# Patient Record
Sex: Female | Born: 2000 | Race: White | Hispanic: No | Marital: Single | Smoking: Never smoker
Health system: Southern US, Community
[De-identification: ages and names within clinical notes are randomized; demographics above are authoritative.]

## PROBLEM LIST (undated history)

## (undated) DIAGNOSIS — K219 Gastro-esophageal reflux disease without esophagitis: Secondary | ICD-10-CM

## (undated) DIAGNOSIS — E301 Precocious puberty: Secondary | ICD-10-CM

## (undated) DIAGNOSIS — IMO0001 Reserved for inherently not codable concepts without codable children: Secondary | ICD-10-CM

## (undated) HISTORY — PX: MRI: SHX5353

## (undated) HISTORY — PX: CLOSED REDUCTION FIBULA: SHX5411

---
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
## 2021-04-20 ENCOUNTER — Encounter (HOSPITAL_COMMUNITY): Payer: Self-pay

## 2021-04-20 ENCOUNTER — Other Ambulatory Visit: Payer: Self-pay

## 2021-04-20 ENCOUNTER — Emergency Department (HOSPITAL_COMMUNITY)
Admission: EM | Admit: 2021-04-20 | Discharge: 2021-04-20 | Disposition: A | Discharge status: Home / Self Care | Payer: Medicaid Other | Attending: Emergency Medicine | Admitting: Emergency Medicine

## 2021-04-20 DIAGNOSIS — Z7722 Contact with and (suspected) exposure to environmental tobacco smoke (acute) (chronic): Secondary | ICD-10-CM | POA: Insufficient documentation

## 2021-04-20 DIAGNOSIS — R309 Painful micturition, unspecified: Secondary | ICD-10-CM | POA: Diagnosis not present

## 2021-04-20 DIAGNOSIS — R300 Dysuria: Secondary | ICD-10-CM | POA: Insufficient documentation

## 2021-04-20 DIAGNOSIS — R102 Pelvic and perineal pain: Secondary | ICD-10-CM | POA: Diagnosis not present

## 2021-04-20 DIAGNOSIS — R21 Rash and other nonspecific skin eruption: Secondary | ICD-10-CM | POA: Diagnosis not present

## 2021-04-20 LAB — WET PREP, GENITAL
Sperm: NONE SEEN
Trich, Wet Prep: NONE SEEN
Yeast Wet Prep HPF POC: NONE SEEN

## 2021-04-20 LAB — URINALYSIS, ROUTINE W REFLEX MICROSCOPIC
Bilirubin Urine: NEGATIVE
Glucose, UA: NEGATIVE mg/dL
Hgb urine dipstick: NEGATIVE
Ketones, ur: NEGATIVE mg/dL
Leukocytes,Ua: NEGATIVE
Nitrite: NEGATIVE
Protein, ur: NEGATIVE mg/dL
Specific Gravity, Urine: 1.009 (ref 1.005–1.030)
pH: 6 (ref 5.0–8.0)

## 2021-04-20 LAB — POC URINE PREG, ED: Preg Test, Ur: NEGATIVE

## 2021-04-20 NOTE — ED Triage Notes (Signed)
Pt arrived via POV c/o uterine pain and dysuria on-going for 1 week. Pt reports also having some bumps around her groin. Pt reports pain feels like her uterus is squeezing and also reports being seen here about a year ago for similar symptoms.

## 2021-04-20 NOTE — ED Notes (Signed)
ED Provider at bedside. 

## 2021-04-20 NOTE — ED Provider Notes (Signed)
[facility] EMERGENCY DEPARTMENT Provider Note   CSN: [id number] Arrival date & time: 04/20/21  3428     History Chief Complaint  Patient presents with   Dysuria    Julie Wheeler is a 20 y.o. female.  Patient presents to the emergency department for evaluation of pelvic pain.  Patient reports that she did notice burning with urination onset approximately a week ago.  Since that time she has been experiencing cramping pain in the lower abdomen and pelvic region.  Patient reports that this feels similar to the pains she had 1 year ago when she was seen in this emergency department.  At that time she was diagnosed with chlamydia.      Past Medical History:  Diagnosis Date   Acid reflux    no current meds.   Onset of menses    LMP 12/19/2011 - has already started periods   Precocious puberty 12/2011    Patient Active Problem List   Diagnosis Date Noted   Premature puberty 10/30/2011   Morbid obesity (HCC) 10/30/2011    Past Surgical History:  Procedure Laterality Date   CLOSED REDUCTION FIBULA     MRI     sedated for MRI   SUPPRELIN IMPLANT  01/08/2012   Procedure: SUPPRELIN IMPLANT;  Surgeon: Judie Petit. Leonia Corona, MD;  Location: [facility];  Service: Pediatrics;  Laterality: Left;     OB History   No obstetric history on file.     Family History  Problem Relation Age of Onset   Cancer Brother        hx. neuroblastoma as a child   Cancer Paternal Grandmother    Hypertension Maternal Aunt    Hypertension Maternal Grandfather    Anesthesia problems Maternal Grandmother        post-op N/V    Social History   Tobacco Use   Smoking status: Never    Passive exposure: Yes   Smokeless tobacco: Never   Tobacco comments:    mother smokes inside  Vaping Use   Vaping Use: Every day   Substances: Nicotine  Substance Use Topics   Alcohol use: No   Drug use: No    Home Medications Prior to Admission medications   Medication Sig Start Date End Date  Taking? Authorizing Provider  benzonatate (TESSALON) 200 MG capsule Take 1 capsule (200 mg total) by mouth 3 (three) times daily as needed for cough. Swallow whole, do not chew 04/15/20   Triplett, Tammy, PA-C  ibuprofen (ADVIL) 600 MG tablet Take 1 tablet (600 mg total) by mouth every 8 (eight) hours as needed for moderate pain. Take with food 04/15/20   Triplett, Tammy, PA-C  ondansetron (ZOFRAN) 4 MG tablet Take 1 tablet (4 mg total) by mouth every 6 (six) hours. 04/15/20   Triplett, Tammy, PA-C    Allergies    Sulfa antibiotics  Review of Systems   Review of Systems  Genitourinary:  Positive for dysuria and pelvic pain.  All other systems reviewed and are negative.  Physical Exam Updated Vital Signs BP (!) 146/79 (BP Location: Left Arm)   Pulse 67   Temp 98.1 F (36.7 C) (Oral)   Resp 16   Ht 5' 5.5" (1.664 m)   Wt 81.6 kg   LMP 04/13/2021 (Exact Date)   SpO2 100%   BMI 29.50 kg/m   Physical Exam Vitals and nursing note reviewed. Exam conducted with a chaperone present.  Constitutional:      General: She is  not in acute distress.    Appearance: Normal appearance. She is well-developed.  HENT:     Head: Normocephalic and atraumatic.     Right Ear: Hearing normal.     Left Ear: Hearing normal.     Nose: Nose normal.  Eyes:     Conjunctiva/sclera: Conjunctivae normal.     Pupils: Pupils are equal, round, and reactive to light.  Cardiovascular:     Rate and Rhythm: Regular rhythm.     Heart sounds: S1 normal and S2 normal. No murmur heard.   No friction rub. No gallop.  Pulmonary:     Effort: Pulmonary effort is normal. No respiratory distress.     Breath sounds: Normal breath sounds.  Chest:     Chest wall: No tenderness.  Abdominal:     General: Bowel sounds are normal.     Palpations: Abdomen is soft.     Tenderness: There is no abdominal tenderness. There is no guarding or rebound. Negative signs include Murphy's sign and McBurney's sign.     Hernia: No hernia  is present. There is no hernia in the left inguinal area or right inguinal area.  Genitourinary:    General: Normal vulva.     Labia:        Left: Rash (razor burn) present.      Vagina: Normal.     Cervix: No cervical motion tenderness.     Uterus: Normal.      Adnexa: Right adnexa normal and left adnexa normal.  Musculoskeletal:        General: Normal range of motion.     Cervical back: Normal range of motion and neck supple.  Lymphadenopathy:     Lower Body: No right inguinal adenopathy. No left inguinal adenopathy.  Skin:    General: Skin is warm and dry.     Findings: No rash.  Neurological:     Mental Status: She is alert and oriented to person, place, and time.     GCS: GCS eye subscore is 4. GCS verbal subscore is 5. GCS motor subscore is 6.     Cranial Nerves: No cranial nerve deficit.     Sensory: No sensory deficit.     Coordination: Coordination normal.  Psychiatric:        Speech: Speech normal.        Behavior: Behavior normal.        Thought Content: Thought content normal.    ED Results / Procedures / Treatments   Labs (all labs ordered are listed, but only abnormal results are displayed) Labs Reviewed  WET PREP, GENITAL - Abnormal; Notable for the following components:      Result Value   Clue Cells Wet Prep HPF POC PRESENT (*)    WBC, Wet Prep HPF POC FEW (*)    All other components within normal limits  URINALYSIS, ROUTINE W REFLEX MICROSCOPIC - Abnormal; Notable for the following components:   Color, Urine STRAW (*)    All other components within normal limits  POC URINE PREG, ED  GC/CHLAMYDIA PROBE AMP ([place]) NOT AT [facility]    EKG None  Radiology No results found.  Procedures Procedures   Medications Ordered in ED Medications - No data to display  ED Course  I have reviewed the triage vital signs and the nursing notes.  Pertinent labs & imaging results that were available during my care of the patient were reviewed by me and  considered in my medical decision making (see chart  for details).    MDM Rules/Calculators/A&P                           Patient presents to the emergency department for evaluation of pelvic pain.  Patient reports that she started to have some dysuria 1 week ago.  She has been having some cramping pain in her pelvis that has become concerning to her.  Patient reports that she had a chlamydia infection a year ago and is worried that she has formed scar tissue from that infection and will become infertile.  She also reported an external rash.  There was a couple of very fine bumps on the left labia consistent with irritation from shaving.  No evidence of herpes or other STI externally.  Pelvic exam is also normal.  No cervical motion tenderness or discharge.  Adnexa are nontender, nonpalpable.  Urinalysis without infection.  Source of cramping pain is unclear, does not appear to be in acute medical emergency.  She is not currently experiencing the pain.  Will discharge, follow-up with OB/GYN.  Final Clinical Impression(s) / ED Diagnoses Final diagnoses:  Dysuria  Pelvic pain    Rx / DC Orders ED Discharge Orders     None        Gilda Crease, MD 04/20/21 [phone]

## 2021-04-22 ENCOUNTER — Telehealth: Payer: Self-pay

## 2021-04-22 LAB — GC/CHLAMYDIA PROBE AMP (~~LOC~~) NOT AT ARMC
Chlamydia: NEGATIVE
Comment: NEGATIVE
Comment: NORMAL
Neisseria Gonorrhea: NEGATIVE

## 2021-04-22 NOTE — Telephone Encounter (Signed)
Transition Care Management Unsuccessful Follow-up Telephone Call  Date of discharge and from where:  04/20/2021 from [facility]  Attempts:  1st Attempt  Reason for unsuccessful TCM follow-up call:  Left voice message

## 2021-04-23 NOTE — Telephone Encounter (Signed)
Transition Care Management Unsuccessful Follow-up Telephone Call  Date of discharge and from where:  04/20/2021 from [facility]  Attempts:  2nd Attempt  Reason for unsuccessful TCM follow-up call:  Left voice message

## 2021-04-24 NOTE — Telephone Encounter (Signed)
Transition Care Management Unsuccessful Follow-up Telephone Call  Date of discharge and from where:  04/20/2021 from [facility]  Attempts:  3rd Attempt  Reason for unsuccessful TCM follow-up call:  Unable to reach patient

## 2021-06-26 ENCOUNTER — Emergency Department (HOSPITAL_COMMUNITY)
Admission: EM | Admit: 2021-06-26 | Discharge: 2021-06-27 | Disposition: A | Discharge status: Home / Self Care | Payer: Medicaid Other | Attending: Emergency Medicine | Admitting: Emergency Medicine

## 2021-06-26 ENCOUNTER — Encounter (HOSPITAL_COMMUNITY): Payer: Self-pay | Admitting: *Deleted

## 2021-06-26 ENCOUNTER — Other Ambulatory Visit: Payer: Self-pay

## 2021-06-26 DIAGNOSIS — Z7722 Contact with and (suspected) exposure to environmental tobacco smoke (acute) (chronic): Secondary | ICD-10-CM | POA: Insufficient documentation

## 2021-06-26 DIAGNOSIS — L03115 Cellulitis of right lower limb: Secondary | ICD-10-CM | POA: Insufficient documentation

## 2021-06-26 DIAGNOSIS — R2241 Localized swelling, mass and lump, right lower limb: Secondary | ICD-10-CM | POA: Diagnosis present

## 2021-06-26 NOTE — ED Triage Notes (Signed)
Pt infected tattoo to right thigh that pt did herself for the past several days. Pt denies any fevers or N/V.

## 2021-06-27 MED ORDER — CEPHALEXIN 500 MG PO CAPS
500.0000 mg | ORAL_CAPSULE | Freq: Once | ORAL | Status: AC
  Administered 2021-06-27: 500 mg via ORAL
  Filled 2021-06-27: qty 1

## 2021-06-27 MED ORDER — CEPHALEXIN 500 MG PO CAPS: 500.0000 mg | ORAL_CAPSULE | Freq: Three times a day (TID) | ORAL | 0 refills | Status: DC

## 2021-06-27 NOTE — Discharge Instructions (Addendum)
Please make sure to use only licensed tattoo parlor's when getting tattoos in the future.  Apply ice for 30 minutes at a time, 4 times a day.  You may take acetaminophen and/or ibuprofen as needed for pain.  Please note that combining acetaminophen and ibuprofen gives you better pain relief than either medication by itself.  The following is a message that was attempted to be sent to you through MyChart.  Please use the information in that message to troubleshoot your account.  Dear Patient,   We see you have an active account, Visit [email or web address] You can click forgot username/[password reset or Please call if you need further assisatcne from [place] . MyChart Support: [phone] or [phone]   Thank you and have a good day. Mychart Tech support.

## 2021-06-27 NOTE — ED Provider Notes (Signed)
[facility] EMERGENCY DEPARTMENT Provider Note   CSN: [id number] Arrival date & time: 06/26/21  2239     History Chief Complaint  Patient presents with   tattoo infected    Julie Wheeler is a 20 y.o. female.  The history is provided by the patient.  She got a new tattoo on her right thigh about 1 week ago.  This was not done by a licensed tattoo parlor.  She started having some redness and warmth and pain the next day.  It was significantly swollen.  The swelling has gone down but the redness has not.  He continues to be painful when she moves or walks.  She has been taking ibuprofen for pain which does give temporary relief.  She denies fever, chills, sweats.   Past Medical History:  Diagnosis Date   Acid reflux    no current meds.   Onset of menses    LMP 12/19/2011 - has already started periods   Precocious puberty 12/2011    Patient Active Problem List   Diagnosis Date Noted   Premature puberty 10/30/2011   Morbid obesity (HCC) 10/30/2011    Past Surgical History:  Procedure Laterality Date   CLOSED REDUCTION FIBULA     MRI     sedated for MRI   SUPPRELIN IMPLANT  01/08/2012   Procedure: SUPPRELIN IMPLANT;  Surgeon: Judie Petit. Leonia Corona, MD;  Location: [facility];  Service: Pediatrics;  Laterality: Left;     OB History   No obstetric history on file.     Family History  Problem Relation Age of Onset   Cancer Brother        hx. neuroblastoma as a child   Cancer Paternal Grandmother    Hypertension Maternal Aunt    Hypertension Maternal Grandfather    Anesthesia problems Maternal Grandmother        post-op N/V    Social History   Tobacco Use   Smoking status: Never    Passive exposure: Yes   Smokeless tobacco: Never   Tobacco comments:    mother smokes inside  Vaping Use   Vaping Use: Every day   Substances: Nicotine  Substance Use Topics   Alcohol use: No   Drug use: No    Home Medications Prior to Admission medications    Medication Sig Start Date End Date Taking? Authorizing Provider  benzonatate (TESSALON) 200 MG capsule Take 1 capsule (200 mg total) by mouth 3 (three) times daily as needed for cough. Swallow whole, do not chew 04/15/20   Triplett, Tammy, PA-C  ibuprofen (ADVIL) 600 MG tablet Take 1 tablet (600 mg total) by mouth every 8 (eight) hours as needed for moderate pain. Take with food 04/15/20   Triplett, Tammy, PA-C  ondansetron (ZOFRAN) 4 MG tablet Take 1 tablet (4 mg total) by mouth every 6 (six) hours. 04/15/20   Triplett, Tammy, PA-C    Allergies    Sulfa antibiotics  Review of Systems   Review of Systems  All other systems reviewed and are negative.  Physical Exam Updated Vital Signs BP 131/88 (BP Location: Right Arm)    Pulse (!) 115    Temp 97.9 F (36.6 C) (Oral)    Resp 16    Ht 5\' 5"  (1.651 m)    Wt 83.9 kg    LMP 05/29/2021    SpO2 100%    BMI 30.79 kg/m   Physical Exam Vitals and nursing note reviewed.  20 year old female, resting  comfortably and in no acute distress. Vital signs are significant for elevated heart rate. Oxygen saturation is 100%, which is normal. Head is normocephalic and atraumatic. PERRLA, EOMI.  Lungs are clear without rales, wheezes, or rhonchi. Chest is nontender. Heart has regular rate and rhythm without murmur. Abdomen is soft, flat, nontender. Extremities: Tattoo is present in the anterior aspect of the right thigh with erythema surrounding the tattoo.  The tattoo itself is warm to the touch and slightly raised and moderately tender.  No lymphangitic streaks are seen. Skin is warm and dry without rash. Neurologic: Mental status is normal, cranial nerves are intact, moves all extremities equally.  ED Results / Procedures / Treatments    Procedures Procedures   Medications Ordered in ED Medications  cephALEXin (KEFLEX) capsule 500 mg (has no administration in time range)    ED Course  I have reviewed the triage vital signs and the nursing  notes.  MDM Rules/Calculators/A&P                         Tattoo of the right leg with secondary cellulitis.  She is discharged with prescription for cephalexin.  Old records are reviewed, and she has no relevant past visits.    As a secondary matter, patient states that she has had difficulty getting into my chart.  It is noted that there was a response generated from recent attempts to get into my chart.  This response was copied and pasted into her AVS.  Final Clinical Impression(s) / ED Diagnoses Final diagnoses:  Cellulitis of right thigh    Rx / DC Orders ED Discharge Orders          Ordered    cephALEXin (KEFLEX) 500 MG capsule  3 times daily        06/27/21 0007             Dione Booze, MD 06/27/21 [phone]

## 2021-12-19 DIAGNOSIS — J069 Acute upper respiratory infection, unspecified: Secondary | ICD-10-CM | POA: Diagnosis not present

## 2021-12-19 DIAGNOSIS — Z20822 Contact with and (suspected) exposure to covid-19: Secondary | ICD-10-CM | POA: Diagnosis not present

## 2022-02-05 ENCOUNTER — Encounter: Payer: Medicaid Other | Admitting: Adult Health

## 2022-02-18 IMAGING — CT CT ABD-PELV W/ CM
2 of 4 series · 16 of 46 positions shown, 18 images · IV contrast (Omnipaque or Isovue)
Comparison: None.

CLINICAL DATA: Right lower quadrant pain. Concern for appendicitis.

EXAM:
CT ABDOMEN AND PELVIS WITH CONTRAST
TECHNIQUE: Multidetector CT imaging of the abdomen and pelvis was performed
using the standard protocol following bolus administration of
intravenous contrast.
CONTRAST:  75mL OMNIPAQUE IOHEXOL 300 MG/ML  SOLN

[Series 2: axial st · axial · 0.87mm/px · z∈[+754,+1179]mm · 13 of 95 slices shown, 15 images]
[im 5/95  soft-tissue]
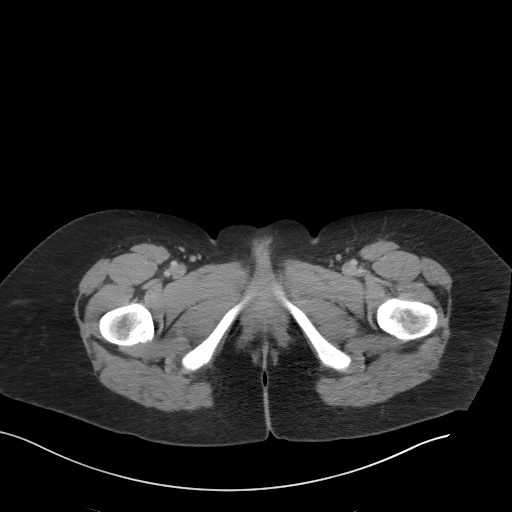
[im 5/95  bone]
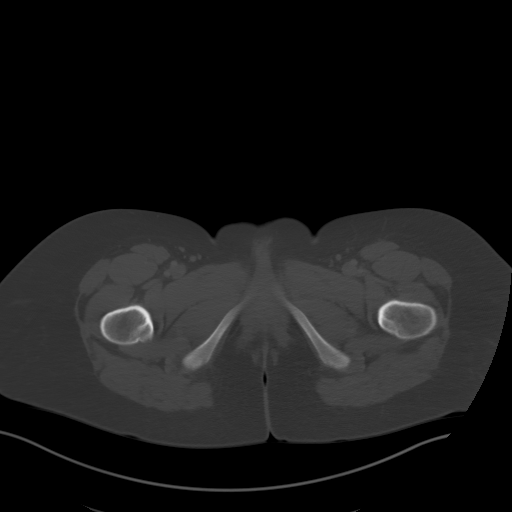
[im 13/95  soft-tissue]
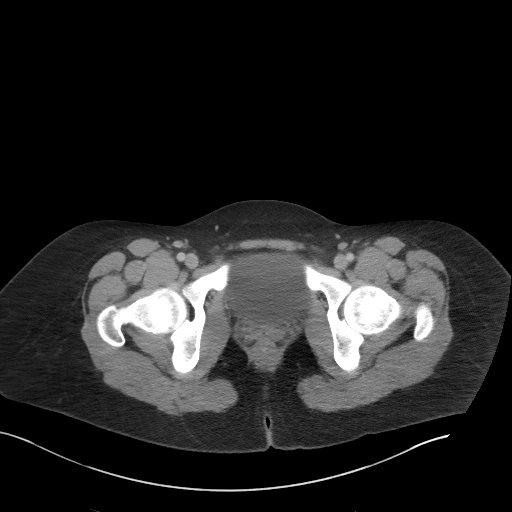
[im 22/95  soft-tissue]
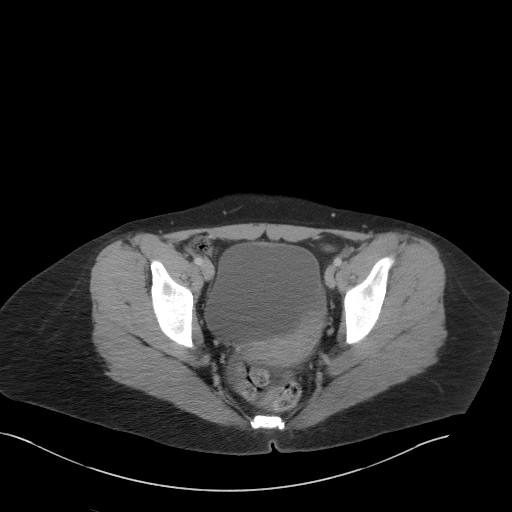
[im 26/95  soft-tissue]
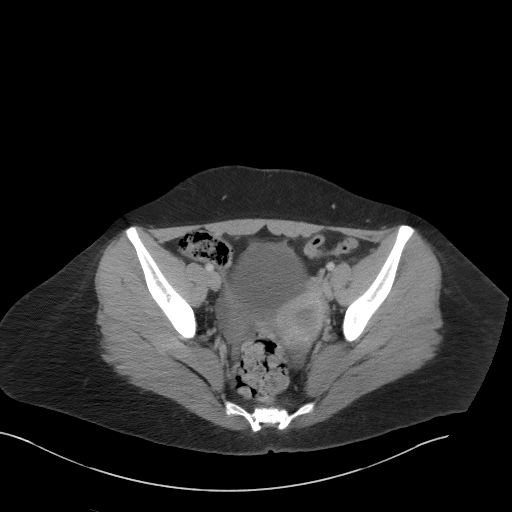
[im 35/95  soft-tissue]
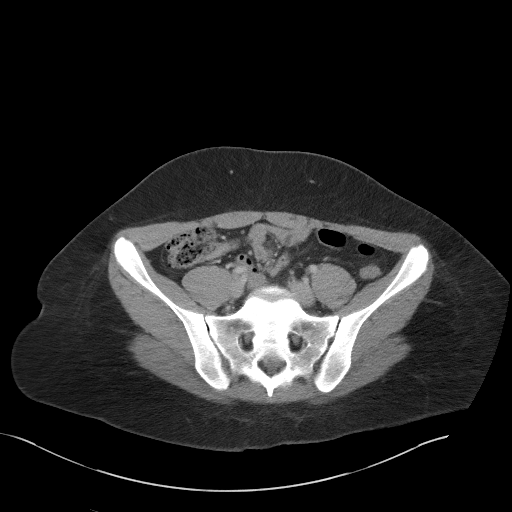
[im 39/95  soft-tissue]
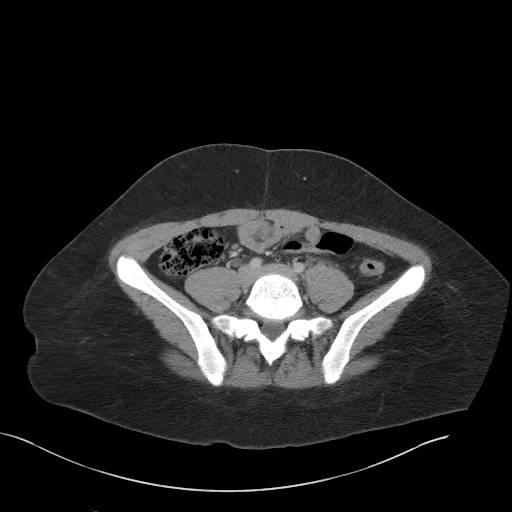
[im 48/95  soft-tissue]
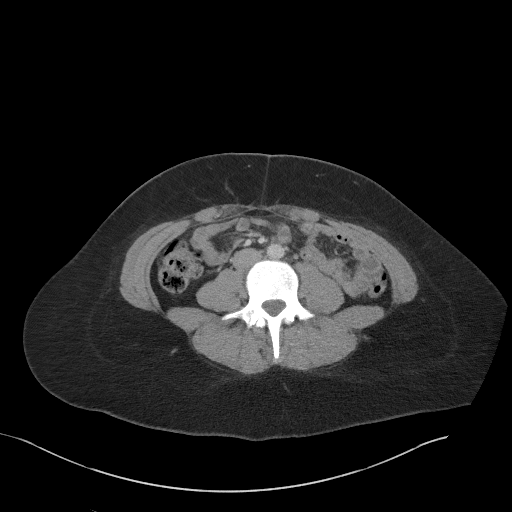
[im 56/95  soft-tissue]
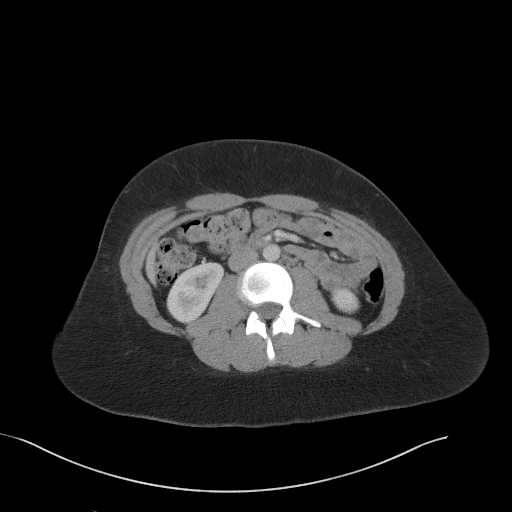
[im 60/95  soft-tissue]
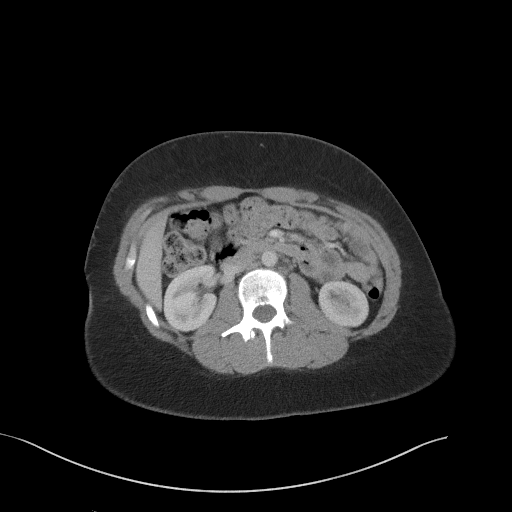
[im 60/95  bone]
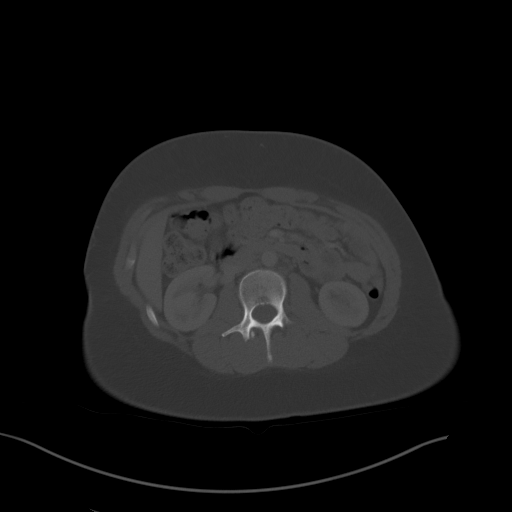
[im 69/95  soft-tissue]
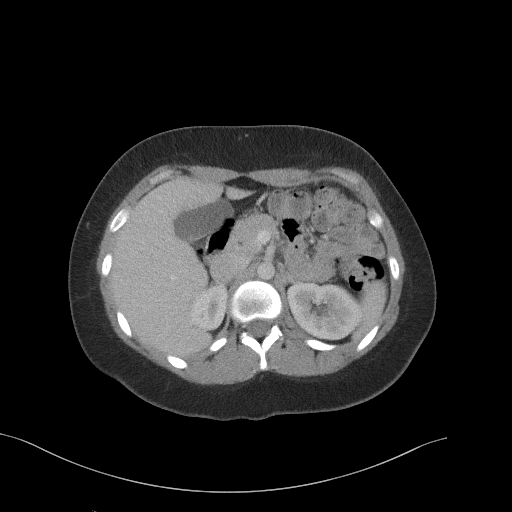
[im 73/95  soft-tissue]
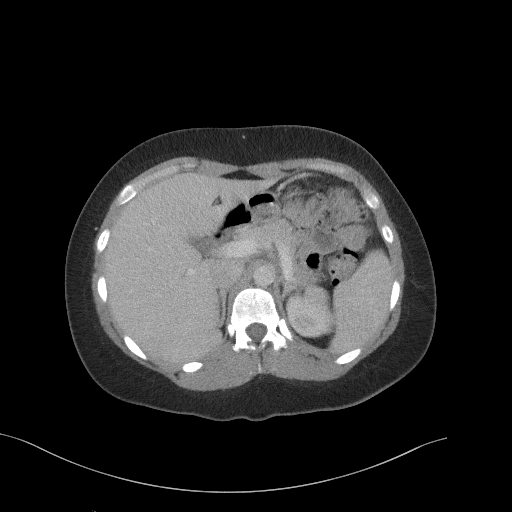
[im 82/95  soft-tissue]
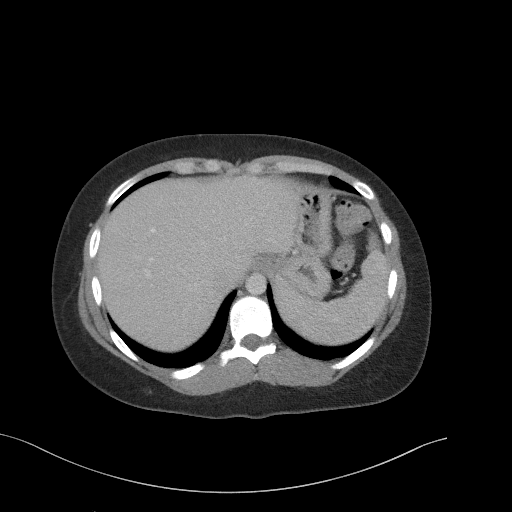
[im 90/95  soft-tissue]
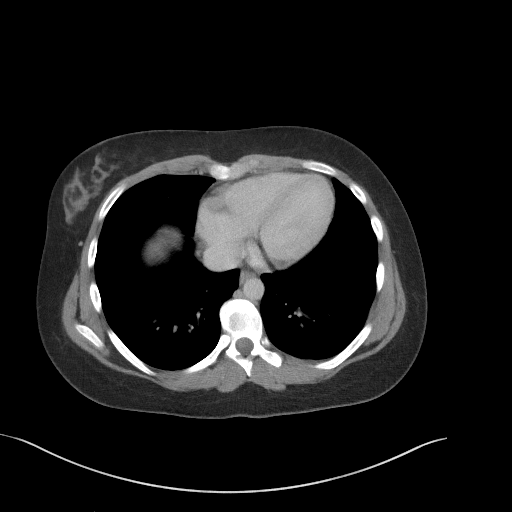

[Series 5: coronal st · coronal · 0.86mm/px · 3 of 102 slices shown]
[im 34/102  soft-tissue]
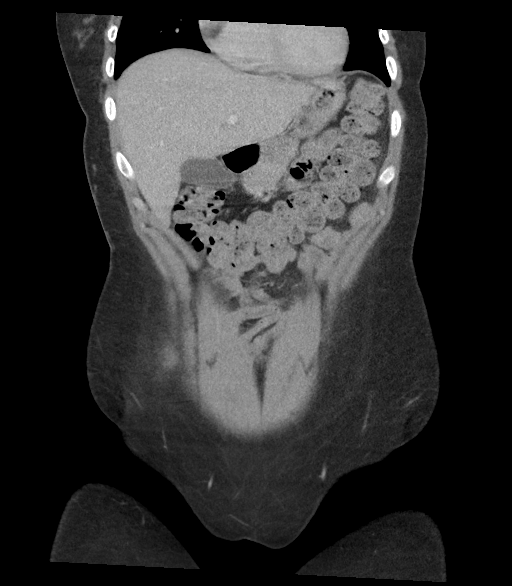
[im 45/102  soft-tissue]
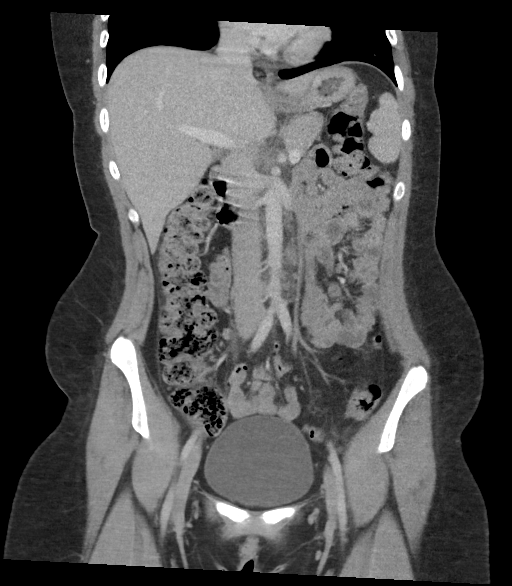
[im 57/102  soft-tissue]
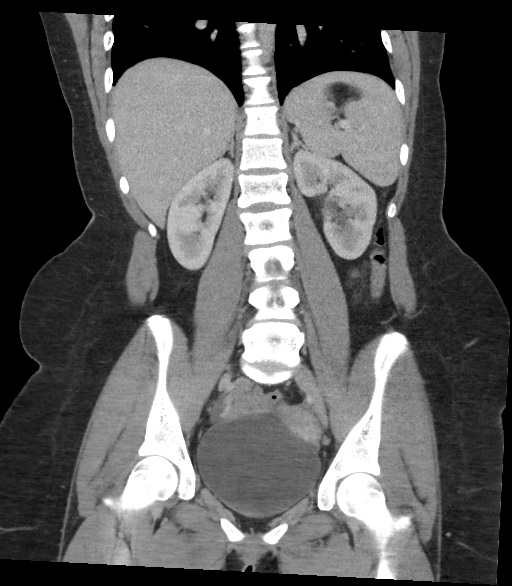

[16 of 46 positions shown; findings below may reference images not displayed]

FINDINGS: Lower chest: The lung bases are clear. The heart size is normal.

Hepatobiliary: The liver is normal. Normal gallbladder.There is no
biliary ductal dilation.

Pancreas: Normal contours without ductal dilatation. No
peripancreatic fluid collection.

Spleen: Unremarkable.

Adrenals/Urinary Tract:

--Adrenal glands: Unremarkable.

--Right kidney/ureter: No hydronephrosis or radiopaque kidney
stones.

--Left kidney/ureter: No hydronephrosis or radiopaque kidney stones.

--Urinary bladder: Unremarkable.

Stomach/Bowel:

--Stomach/Duodenum: No hiatal hernia or other gastric abnormality.
Normal duodenal course and caliber.

--Small bowel: Unremarkable.

--Colon: Unremarkable.

--Appendix: Normal.

Vascular/Lymphatic: Normal course and caliber of the major abdominal
vessels.

--No retroperitoneal lymphadenopathy.

--No mesenteric lymphadenopathy.

--No pelvic or inguinal lymphadenopathy.

Reproductive: Unremarkable

Other: There is a small volume of pelvic free fluid which is likely
physiologic. No free air. The abdominal wall is normal.

Musculoskeletal. No acute displaced fractures.
IMPRESSION: 1. No acute abdominopelvic abnormality. Specifically, the appendix
is normal.
2. Small volume of pelvic free fluid is likely physiologic.

## 2022-05-26 DIAGNOSIS — J329 Chronic sinusitis, unspecified: Secondary | ICD-10-CM | POA: Diagnosis not present

## 2022-05-26 DIAGNOSIS — F330 Major depressive disorder, recurrent, mild: Secondary | ICD-10-CM | POA: Diagnosis not present

## 2022-05-26 DIAGNOSIS — F329 Major depressive disorder, single episode, unspecified: Secondary | ICD-10-CM | POA: Insufficient documentation

## 2022-05-26 DIAGNOSIS — R059 Cough, unspecified: Secondary | ICD-10-CM | POA: Diagnosis not present

## 2022-06-02 ENCOUNTER — Encounter: Payer: Self-pay | Admitting: Obstetrics & Gynecology

## 2022-06-02 ENCOUNTER — Ambulatory Visit (INDEPENDENT_AMBULATORY_CARE_PROVIDER_SITE_OTHER): Payer: Medicaid Other | Admitting: Obstetrics & Gynecology

## 2022-06-02 ENCOUNTER — Other Ambulatory Visit (HOSPITAL_COMMUNITY)
Admission: RE | Admit: 2022-06-02 | Discharge: 2022-06-02 | Disposition: A | Discharge status: Home / Self Care | Payer: Medicaid Other | Source: Ambulatory Visit | Attending: Obstetrics & Gynecology | Admitting: Obstetrics & Gynecology

## 2022-06-02 VITALS — BP 116/67 | HR 77 | Ht 65.0 in | Wt 181.0 lb

## 2022-06-02 DIAGNOSIS — Z113 Encounter for screening for infections with a predominantly sexual mode of transmission: Secondary | ICD-10-CM

## 2022-06-02 DIAGNOSIS — Z3046 Encounter for surveillance of implantable subdermal contraceptive: Secondary | ICD-10-CM

## 2022-06-02 DIAGNOSIS — Z538 Procedure and treatment not carried out for other reasons: Secondary | ICD-10-CM | POA: Diagnosis not present

## 2022-06-02 DIAGNOSIS — Z30011 Encounter for initial prescription of contraceptive pills: Secondary | ICD-10-CM | POA: Diagnosis not present

## 2022-06-02 MED ORDER — NORETHIN ACE-ETH ESTRAD-FE 1-20 MG-MCG(24) PO TABS: 1.0000 | ORAL_TABLET | Freq: Every day | ORAL | 4 refills | Status: DC
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
## 2022-06-03 LAB — HSV 1 AND 2 AB, IGG
HSV 1 Glycoprotein G Ab, IgG: 11.7 index — ABNORMAL HIGH (ref 0.00–0.90)
HSV 2 IgG, Type Spec: <0.91 index (ref 0.00–0.90)

## 2022-06-04 ENCOUNTER — Other Ambulatory Visit: Payer: Self-pay | Admitting: Obstetrics & Gynecology

## 2022-06-04 ENCOUNTER — Encounter: Payer: Self-pay | Admitting: Obstetrics & Gynecology

## 2022-06-04 DIAGNOSIS — B9689 Other specified bacterial agents as the cause of diseases classified elsewhere: Secondary | ICD-10-CM

## 2022-06-04 LAB — CERVICOVAGINAL ANCILLARY ONLY
Bacterial Vaginitis (gardnerella): POSITIVE — AB
Candida Glabrata: NEGATIVE
Candida Vaginitis: NEGATIVE
Chlamydia: NEGATIVE
Comment: NEGATIVE
Comment: NEGATIVE
Comment: NEGATIVE
Comment: NEGATIVE
Comment: NEGATIVE
Comment: NORMAL
Neisseria Gonorrhea: NEGATIVE
Trichomonas: NEGATIVE

## 2022-06-04 LAB — RPR, QUANT+TP ABS (REFLEX)
Rapid Plasma Reagin, Quant: 1:1 {titer} — ABNORMAL HIGH
T Pallidum Abs: NONREACTIVE

## 2022-06-04 LAB — RPR: RPR Ser Ql: REACTIVE — AB

## 2022-06-04 LAB — HIV ANTIBODY (ROUTINE TESTING W REFLEX): HIV Screen 4th Generation wRfx: NONREACTIVE

## 2022-06-04 MED ORDER — METRONIDAZOLE 500 MG PO TABS: 500.0000 mg | ORAL_TABLET | Freq: Two times a day (BID) | ORAL | 0 refills | Status: AC

## 2022-06-04 NOTE — Progress Notes (Signed)
Rx sent in for BV Tried calling pt-unable to leave a message

## 2022-06-10 DIAGNOSIS — F330 Major depressive disorder, recurrent, mild: Secondary | ICD-10-CM | POA: Diagnosis not present

## 2022-06-10 DIAGNOSIS — Z3046 Encounter for surveillance of implantable subdermal contraceptive: Secondary | ICD-10-CM | POA: Diagnosis not present

## 2022-06-10 DIAGNOSIS — Z79899 Other long term (current) drug therapy: Secondary | ICD-10-CM | POA: Diagnosis not present

## 2022-06-16 DIAGNOSIS — Z79899 Other long term (current) drug therapy: Secondary | ICD-10-CM | POA: Diagnosis not present

## 2022-06-16 DIAGNOSIS — F330 Major depressive disorder, recurrent, mild: Secondary | ICD-10-CM | POA: Diagnosis not present

## 2022-06-30 DIAGNOSIS — F330 Major depressive disorder, recurrent, mild: Secondary | ICD-10-CM | POA: Diagnosis not present

## 2022-06-30 DIAGNOSIS — Z79899 Other long term (current) drug therapy: Secondary | ICD-10-CM | POA: Diagnosis not present

## 2022-07-17 DIAGNOSIS — F330 Major depressive disorder, recurrent, mild: Secondary | ICD-10-CM | POA: Diagnosis not present

## 2022-07-17 DIAGNOSIS — Z79899 Other long term (current) drug therapy: Secondary | ICD-10-CM | POA: Diagnosis not present

## 2022-07-20 ENCOUNTER — Encounter (HOSPITAL_COMMUNITY): Payer: Self-pay | Admitting: *Deleted

## 2022-07-20 ENCOUNTER — Emergency Department (HOSPITAL_COMMUNITY)
Admission: EM | Admit: 2022-07-20 | Discharge: 2022-07-20 | Disposition: A | Discharge status: Home / Self Care | Payer: Medicaid Other | Attending: Emergency Medicine | Admitting: Emergency Medicine

## 2022-07-20 ENCOUNTER — Other Ambulatory Visit: Payer: Self-pay

## 2022-07-20 DIAGNOSIS — J020 Streptococcal pharyngitis: Secondary | ICD-10-CM

## 2022-07-20 DIAGNOSIS — R070 Pain in throat: Secondary | ICD-10-CM | POA: Diagnosis present

## 2022-07-20 LAB — GROUP A STREP BY PCR: Group A Strep by PCR: DETECTED — AB

## 2022-07-20 MED ORDER — PENICILLIN V POTASSIUM 500 MG PO TABS: 500.0000 mg | ORAL_TABLET | Freq: Two times a day (BID) | ORAL | 0 refills | Status: DC

## 2022-07-20 MED ORDER — LIDOCAINE VISCOUS HCL 2 % MT SOLN: 5.0000 mL | Freq: Three times a day (TID) | OROMUCOSAL | 0 refills | Status: DC | PRN

## 2022-07-20 NOTE — ED Triage Notes (Signed)
Pt with sore throat x 6 days. Denies fever.

## 2022-07-20 NOTE — Discharge Instructions (Signed)
Your strep test was positive.  Please take the antibiotic as directed until it is finished.  Plenty of fluids.  You may take over-the-counter ibuprofen, 600 mg 3 times a day with food.  Follow-up with your primary care provider for recheck return to emergency department for any new or worsening symptoms.

## 2022-07-20 NOTE — ED Provider Notes (Signed)
[facility] Provider Note   CSN: [id number] Arrival date & time: 07/20/22  1440     History  Chief Complaint  Patient presents with   Sore Throat    Julie Wheeler is a 22 y.o. female.   Sore Throat Pertinent negatives include no chest pain, no headaches and no shortness of breath.       Julie Wheeler is a 22 y.o. female who presents to the Emergency Department complaining of sore throat x 6 days.  States she had a viral illness around Christmas and then developed sore throat.  No known fever.  She has pain of her throat with swallowing.  Also states that her glands feel swollen.  She denies any cough, shortness of breath, abdominal pain, vomiting or diarrhea.  Home Medications Prior to Admission medications   Medication Sig Start Date End Date Taking? Authorizing Provider  FLUoxetine (PROZAC) 10 MG capsule Take 10 mg by mouth daily. 05/26/22 05/26/23  [provider]  Norethindrone Acetate-Ethinyl Estrad-FE (LOESTRIN 24 FE) 1-20 MG-MCG(24) tablet Take 1 tablet by mouth daily. 06/02/22 08/31/22  Janyth Pupa, DO      Allergies    Sulfa antibiotics    Review of Systems   Review of Systems  Constitutional:  Negative for chills and fever.  HENT:  Positive for sore throat and trouble swallowing. Negative for congestion.   Respiratory:  Negative for chest tightness and shortness of breath.   Cardiovascular:  Negative for chest pain.  Gastrointestinal:  Negative for diarrhea, nausea and vomiting.  Genitourinary:  Negative for dysuria.  Musculoskeletal:  Negative for arthralgias, myalgias, neck pain and neck stiffness.  Neurological:  Negative for dizziness, syncope, weakness and headaches.    Physical Exam Updated Vital Signs BP 122/72 (BP Location: Right Arm)   Pulse 80   Temp 98.2 F (36.8 C) (Oral)   Resp 15   Wt 78 kg   LMP 07/07/2022   SpO2 97%   BMI 28.62 kg/m  Physical Exam Vitals and nursing note reviewed.  Constitutional:       General: She is not in acute distress.    Appearance: She is well-developed. She is not toxic-appearing.  HENT:     Mouth/Throat:     Mouth: Mucous membranes are moist.     Pharynx: Posterior oropharyngeal erythema present.     Comments: Erythema of the oropharynx, bilateral tonsils are enlarged, no exudates.  Uvula midline.  Nonedematous no trismus, no hot potato voice. Cardiovascular:     Rate and Rhythm: Normal rate and regular rhythm.     Pulses: Normal pulses.  Pulmonary:     Effort: Pulmonary effort is normal. No respiratory distress.     Breath sounds: No wheezing.  Abdominal:     Palpations: Abdomen is soft.     Tenderness: There is no abdominal tenderness.  Musculoskeletal:        General: Normal range of motion.  Lymphadenopathy:     Cervical: Cervical adenopathy present.  Skin:    General: Skin is warm.     Capillary Refill: Capillary refill takes less than 2 seconds.  Neurological:     General: No focal deficit present.     Mental Status: She is alert.     Sensory: No sensory deficit.     Motor: No weakness.     ED Results / Procedures / Treatments   Labs (all labs ordered are listed, but only abnormal results are displayed) Labs Reviewed  GROUP A STREP  BY PCR - Abnormal; Notable for the following components:      Result Value   Group A Strep by PCR DETECTED (*)    All other components within normal limits    EKG None  Radiology No results found.  Procedures Procedures    Medications Ordered in ED Medications - No data to display  ED Course/ Medical Decision Making/ A&P                           Medical Decision Making Patient here for evaluation of sore throat times nearly 1 week.  Had flulike symptoms over the holidays that she states seem to have resolved.  Denies fever, chest pain, abdominal pain or shortness of breath.  Able to tolerate some foods and drinking liquids.  On my exam, patient well-appearing nontoxic.  No muffled voice,  she is handling secretions without difficulty.  Vital signs are reassuring.  Differential would include strep pharyngitis, viral process, PTA or retropharyngeal abscess.  Clinically, suspect this is related to strep pharyngitis as she has erythema and edema of the bilateral tonsils and bilateral cervical lymphadenopathy.  Amount and/or Complexity of Data Reviewed Labs: ordered.    Details: Strep PCR positive Discussion of management or test interpretation with external provider(s): Discussed findings with patient.  She is agreeable to treatment plan with Pen-Vee K and Magic mouthwash.  She will continue over-the-counter ibuprofen for pain relief.  Encouraged to drink fluids.           Final Clinical Impression(s) / ED Diagnoses Final diagnoses:  Strep pharyngitis    Rx / DC Orders ED Discharge Orders     None         Pauline Aus, PA-C 07/20/22 1634    Bethann Berkshire, MD 07/21/22 [phone]

## 2022-07-22 ENCOUNTER — Telehealth: Payer: Self-pay | Admitting: *Deleted

## 2022-07-22 NOTE — Patient Outreach (Signed)
  Care Coordination [facility] Note Transition Care Management Unsuccessful Follow-up Telephone Call  Date of discharge and from where:  07/20/22 from [facility]  Attempts:  1st Attempt  Reason for unsuccessful TCM follow-up call:  Missing or invalid number   Lurena Joiner RN, [place] RN Care Coordinator

## 2022-07-31 DIAGNOSIS — R509 Fever, unspecified: Secondary | ICD-10-CM | POA: Diagnosis not present

## 2022-07-31 DIAGNOSIS — Z79899 Other long term (current) drug therapy: Secondary | ICD-10-CM | POA: Diagnosis not present

## 2022-07-31 DIAGNOSIS — F330 Major depressive disorder, recurrent, mild: Secondary | ICD-10-CM | POA: Diagnosis not present

## 2022-08-28 DIAGNOSIS — Z79899 Other long term (current) drug therapy: Secondary | ICD-10-CM | POA: Diagnosis not present

## 2022-08-28 DIAGNOSIS — F330 Major depressive disorder, recurrent, mild: Secondary | ICD-10-CM | POA: Diagnosis not present

## 2022-09-04 DIAGNOSIS — F603 Borderline personality disorder: Secondary | ICD-10-CM | POA: Diagnosis not present

## 2022-09-21 DIAGNOSIS — R1031 Right lower quadrant pain: Secondary | ICD-10-CM | POA: Diagnosis not present

## 2022-09-25 DIAGNOSIS — F603 Borderline personality disorder: Secondary | ICD-10-CM | POA: Diagnosis not present

## 2022-10-16 DIAGNOSIS — F603 Borderline personality disorder: Secondary | ICD-10-CM | POA: Diagnosis not present

## 2022-12-09 DIAGNOSIS — F603 Borderline personality disorder: Secondary | ICD-10-CM | POA: Diagnosis not present

## 2022-12-17 DIAGNOSIS — L089 Local infection of the skin and subcutaneous tissue, unspecified: Secondary | ICD-10-CM | POA: Diagnosis not present

## 2022-12-23 DIAGNOSIS — F319 Bipolar disorder, unspecified: Secondary | ICD-10-CM | POA: Diagnosis not present

## 2023-01-01 DIAGNOSIS — F319 Bipolar disorder, unspecified: Secondary | ICD-10-CM | POA: Diagnosis not present

## 2023-01-06 DIAGNOSIS — F319 Bipolar disorder, unspecified: Secondary | ICD-10-CM | POA: Diagnosis not present

## 2023-01-22 DIAGNOSIS — F319 Bipolar disorder, unspecified: Secondary | ICD-10-CM | POA: Diagnosis not present

## 2023-02-03 DIAGNOSIS — F319 Bipolar disorder, unspecified: Secondary | ICD-10-CM | POA: Diagnosis not present

## 2023-02-12 DIAGNOSIS — F319 Bipolar disorder, unspecified: Secondary | ICD-10-CM | POA: Diagnosis not present

## 2023-02-19 DIAGNOSIS — F603 Borderline personality disorder: Secondary | ICD-10-CM | POA: Diagnosis not present

## 2023-03-05 DIAGNOSIS — F319 Bipolar disorder, unspecified: Secondary | ICD-10-CM | POA: Diagnosis not present

## 2023-03-10 DIAGNOSIS — F319 Bipolar disorder, unspecified: Secondary | ICD-10-CM | POA: Diagnosis not present

## 2023-03-17 DIAGNOSIS — F319 Bipolar disorder, unspecified: Secondary | ICD-10-CM | POA: Diagnosis not present

## 2023-03-23 DIAGNOSIS — F603 Borderline personality disorder: Secondary | ICD-10-CM | POA: Diagnosis not present

## 2023-03-24 DIAGNOSIS — F319 Bipolar disorder, unspecified: Secondary | ICD-10-CM | POA: Diagnosis not present

## 2023-04-02 DIAGNOSIS — F319 Bipolar disorder, unspecified: Secondary | ICD-10-CM | POA: Diagnosis not present

## 2023-04-07 DIAGNOSIS — F319 Bipolar disorder, unspecified: Secondary | ICD-10-CM | POA: Diagnosis not present

## 2023-04-16 DIAGNOSIS — F319 Bipolar disorder, unspecified: Secondary | ICD-10-CM | POA: Diagnosis not present

## 2023-04-23 DIAGNOSIS — F319 Bipolar disorder, unspecified: Secondary | ICD-10-CM | POA: Diagnosis not present

## 2023-04-24 DIAGNOSIS — F319 Bipolar disorder, unspecified: Secondary | ICD-10-CM | POA: Diagnosis not present

## 2023-04-28 DIAGNOSIS — S92902A Unspecified fracture of left foot, initial encounter for closed fracture: Secondary | ICD-10-CM | POA: Diagnosis not present

## 2023-04-28 DIAGNOSIS — S99922A Unspecified injury of left foot, initial encounter: Secondary | ICD-10-CM | POA: Diagnosis not present

## 2023-05-11 ENCOUNTER — Ambulatory Visit: Payer: Medicaid Other | Admitting: Podiatry

## 2023-05-11 ENCOUNTER — Ambulatory Visit (INDEPENDENT_AMBULATORY_CARE_PROVIDER_SITE_OTHER): Payer: Medicaid Other

## 2023-05-11 DIAGNOSIS — M79672 Pain in left foot: Secondary | ICD-10-CM

## 2023-05-11 DIAGNOSIS — S92355A Nondisplaced fracture of fifth metatarsal bone, left foot, initial encounter for closed fracture: Secondary | ICD-10-CM

## 2023-05-11 DIAGNOSIS — S92355S Nondisplaced fracture of fifth metatarsal bone, left foot, sequela: Secondary | ICD-10-CM | POA: Diagnosis not present

## 2023-05-11 DIAGNOSIS — R600 Localized edema: Secondary | ICD-10-CM

## 2023-05-11 DIAGNOSIS — W109XXA Fall (on) (from) unspecified stairs and steps, initial encounter: Secondary | ICD-10-CM

## 2023-05-11 NOTE — Progress Notes (Unsigned)
      HPI: 22 y.o. female presents today with concern of a possible fracture of the left foot.  Patient states she tripped and fell down the steps in her home 2 weeks ago.  She was seen at urgent care and was told that she had a fracture.  She was given a surgical shoe to wear but states that she did not want to wear it, so she refused it and has been full weightbearing in regular shoe gear.  The pain has not improved so she presents today for evaluation  Past Medical History:  Diagnosis Date   Acid reflux    no current meds.   Onset of menses    LMP 12/19/2011 - has already started periods   Precocious puberty 12/2011    Past Surgical History:  Procedure Laterality Date   CLOSED REDUCTION FIBULA     MRI     sedated for MRI   SUPPRELIN IMPLANT  01/08/2012   Procedure: SUPPRELIN IMPLANT;  Surgeon: Judie Petit. Leonia Corona, MD;  Location: [facility];  Service: Pediatrics;  Laterality: Left;    Allergies  Allergen Reactions   Sulfa Antibiotics Rash   Physical Exam: Palpable pedal pulses left foot.  There is localized edema to the lateral and dorsal lateral aspect of the left forefoot.  Pain on palpation along the fifth metatarsal.  No ecchymosis is noted today.  No open lesions are noted.  Patient has antalgic gait secondary to pain  Radiographic Exam (left foot, 3 weightbearing views, 05/11/2023):  Normal osseous mineralization. Joint spaces preserved.  There is a spiral fracture of the distal half of the left fifth metatarsal.  This is extra-articular.  This is in good position.  No significant shortening or angulation is noted.  Assessment/Plan of Care: 1. Closed nondisplaced fracture of fifth metatarsal bone of left foot, initial encounter   2. Left foot pain   3. Fall on stairs, initial encounter   4. Localized edema     PR PNEUMAT WALKING BOOT PRE CST  Discussed clinical and radiographic findings with patient today.  Patient was fitted for a pneumatic cam walker,  prefabricated.  She will wear this at all times when weightbearing.  She should wear this while sleeping but not sure if she will be compliant with this.  She may remove for showering but she needs to try to remain nonweightbearing or use a shower bag over the entire cam walker.  Informed her that a spiral fracture of the fifth metatarsal was unstable.  At keeping the foot immobilized at 90 degrees will give her the best recovery timeframe.  Follow-up in 4 weeks for recheck and x-ray.  Clerance Lav, DPM, FACFAS [facility]     [address] [place], [place] [address]                Office [phone]  Fax [phone]

## 2023-05-13 DIAGNOSIS — F319 Bipolar disorder, unspecified: Secondary | ICD-10-CM | POA: Diagnosis not present

## 2023-05-19 DIAGNOSIS — F319 Bipolar disorder, unspecified: Secondary | ICD-10-CM | POA: Diagnosis not present

## 2023-05-28 DIAGNOSIS — F319 Bipolar disorder, unspecified: Secondary | ICD-10-CM | POA: Diagnosis not present

## 2023-06-09 DIAGNOSIS — F319 Bipolar disorder, unspecified: Secondary | ICD-10-CM | POA: Diagnosis not present

## 2023-06-16 DIAGNOSIS — F603 Borderline personality disorder: Secondary | ICD-10-CM | POA: Diagnosis not present

## 2023-06-30 DIAGNOSIS — F319 Bipolar disorder, unspecified: Secondary | ICD-10-CM | POA: Diagnosis not present

## 2023-07-02 DIAGNOSIS — F319 Bipolar disorder, unspecified: Secondary | ICD-10-CM | POA: Diagnosis not present

## 2023-07-13 ENCOUNTER — Ambulatory Visit (INDEPENDENT_AMBULATORY_CARE_PROVIDER_SITE_OTHER): Payer: Medicaid Other

## 2023-07-13 ENCOUNTER — Ambulatory Visit (INDEPENDENT_AMBULATORY_CARE_PROVIDER_SITE_OTHER): Payer: Medicaid Other | Admitting: Podiatry

## 2023-07-13 ENCOUNTER — Encounter: Payer: Self-pay | Admitting: Podiatry

## 2023-07-13 VITALS — Ht 65.0 in | Wt 172.0 lb

## 2023-07-13 DIAGNOSIS — M79672 Pain in left foot: Secondary | ICD-10-CM | POA: Diagnosis not present

## 2023-07-13 DIAGNOSIS — S92355A Nondisplaced fracture of fifth metatarsal bone, left foot, initial encounter for closed fracture: Secondary | ICD-10-CM

## 2023-07-13 DIAGNOSIS — S92355G Nondisplaced fracture of fifth metatarsal bone, left foot, subsequent encounter for fracture with delayed healing: Secondary | ICD-10-CM

## 2023-07-13 DIAGNOSIS — F319 Bipolar disorder, unspecified: Secondary | ICD-10-CM | POA: Diagnosis not present

## 2023-07-15 ENCOUNTER — Encounter: Payer: Self-pay | Admitting: Podiatry

## 2023-07-15 DIAGNOSIS — S92355A Nondisplaced fracture of fifth metatarsal bone, left foot, initial encounter for closed fracture: Secondary | ICD-10-CM | POA: Insufficient documentation

## 2023-07-22 DIAGNOSIS — F319 Bipolar disorder, unspecified: Secondary | ICD-10-CM | POA: Diagnosis not present

## 2023-07-27 ENCOUNTER — Ambulatory Visit
Admission: RE | Admit: 2023-07-27 | Discharge: 2023-07-27 | Disposition: A | Discharge status: Home / Self Care | Payer: Medicaid Other | Source: Ambulatory Visit | Attending: Podiatry | Admitting: Podiatry

## 2023-07-27 ENCOUNTER — Other Ambulatory Visit: Payer: Medicaid Other

## 2023-07-27 DIAGNOSIS — S92355G Nondisplaced fracture of fifth metatarsal bone, left foot, subsequent encounter for fracture with delayed healing: Secondary | ICD-10-CM

## 2023-07-27 DIAGNOSIS — S92352K Displaced fracture of fifth metatarsal bone, left foot, subsequent encounter for fracture with nonunion: Secondary | ICD-10-CM | POA: Diagnosis not present

## 2023-07-28 DIAGNOSIS — F909 Attention-deficit hyperactivity disorder, unspecified type: Secondary | ICD-10-CM | POA: Diagnosis not present

## 2023-08-05 DIAGNOSIS — F909 Attention-deficit hyperactivity disorder, unspecified type: Secondary | ICD-10-CM | POA: Diagnosis not present

## 2023-08-06 ENCOUNTER — Encounter: Payer: Self-pay | Admitting: Podiatry

## 2023-08-06 DIAGNOSIS — S92355G Nondisplaced fracture of fifth metatarsal bone, left foot, subsequent encounter for fracture with delayed healing: Secondary | ICD-10-CM

## 2023-08-13 DIAGNOSIS — F3181 Bipolar II disorder: Secondary | ICD-10-CM | POA: Diagnosis not present

## 2023-08-14 DIAGNOSIS — F603 Borderline personality disorder: Secondary | ICD-10-CM | POA: Diagnosis not present

## 2023-08-20 DIAGNOSIS — F3181 Bipolar II disorder: Secondary | ICD-10-CM | POA: Diagnosis not present

## 2023-08-31 DIAGNOSIS — S92355K Nondisplaced fracture of fifth metatarsal bone, left foot, subsequent encounter for fracture with nonunion: Secondary | ICD-10-CM | POA: Diagnosis not present

## 2023-09-03 DIAGNOSIS — F909 Attention-deficit hyperactivity disorder, unspecified type: Secondary | ICD-10-CM | POA: Diagnosis not present

## 2023-09-05 ENCOUNTER — Encounter: Payer: Self-pay | Admitting: Obstetrics & Gynecology

## 2023-09-08 DIAGNOSIS — F3181 Bipolar II disorder: Secondary | ICD-10-CM | POA: Diagnosis not present

## 2023-09-15 DIAGNOSIS — F3181 Bipolar II disorder: Secondary | ICD-10-CM | POA: Diagnosis not present

## 2023-09-17 ENCOUNTER — Ambulatory Visit: Payer: Medicaid Other | Admitting: Advanced Practice Midwife

## 2023-09-17 ENCOUNTER — Encounter: Payer: Self-pay | Admitting: Advanced Practice Midwife

## 2023-09-17 VITALS — BP 105/70 | HR 96 | Ht 65.0 in | Wt 198.0 lb

## 2023-09-17 DIAGNOSIS — Z538 Procedure and treatment not carried out for other reasons: Secondary | ICD-10-CM

## 2023-09-17 MED ORDER — PHEXXI 1.8-1-0.4 % VA GEL: VAGINAL | 12 refills | Status: AC

## 2023-09-17 NOTE — Progress Notes (Signed)
  HPI:  Julie Wheeler 23 y.o. here for Supprelen removal.  She got it >10 yuears ago d/t precocious puberty. Dr. Charlotta Newton tried to removed it last year (they were thinking it was Nexplanon) but it was breaking off, Has ben on Loloestrin in the past, doesn't want any hormonal BC  May be interested in phexxi Past Medical History: Past Medical History:  Diagnosis Date   Acid reflux    no current meds.   Onset of menses    LMP 12/19/2011 - has already started periods   Precocious puberty 12/2011    Past Surgical History: Past Surgical History:  Procedure Laterality Date   CLOSED REDUCTION FIBULA     MRI     sedated for MRI   SUPPRELIN IMPLANT  01/08/2012   Procedure: SUPPRELIN IMPLANT;  Surgeon: Judie Petit. Leonia Corona, MD;  Location: [facility];  Service: Pediatrics;  Laterality: Left;    Family History: Family History  Problem Relation Age of Onset   Cancer Brother        hx. neuroblastoma as a child   Cancer Paternal Grandmother    Hypertension Maternal Aunt    Hypertension Maternal Grandfather    Anesthesia problems Maternal Grandmother        post-op N/V    Social History: Social History   Tobacco Use   Smoking status: Never    Passive exposure: Yes   Smokeless tobacco: Never   Tobacco comments:    mother smokes inside  Vaping Use   Vaping status: Every Day   Substances: Nicotine  Substance Use Topics   Alcohol use: No   Drug use: No    Allergies:  Allergies  Allergen Reactions   Fluoxetine Palpitations   Sulfa Antibiotics Rash    Meds: (Not in a hospital admission)     Patient given informed consent for removal of her Supprelin, time out was performed.  Signed copy in the chart.  Appropriate time out taken. Implanon site identified.  Area prepped in usual sterile fashon. One cc of 1% lidocaine was used to anesthetize the area at the distal end of the implant. A small stab incision was made right beside the implant on the distal portion.  The Supprelin  rod was grasped using hemostats.  However, it broke off when pulled, as w/prior attempt.  When grasped lower, it just wouldn't come.  There was less than 3 cc blood loss. A pressure bandage was applied to reduce any bruising.  The patient tolerated the procedure well and was given post procedure instructions.   Surgical consult ordered for surgical removal

## 2023-09-22 DIAGNOSIS — F603 Borderline personality disorder: Secondary | ICD-10-CM | POA: Diagnosis not present

## 2023-09-24 DIAGNOSIS — F3181 Bipolar II disorder: Secondary | ICD-10-CM | POA: Diagnosis not present

## 2023-10-01 DIAGNOSIS — F3181 Bipolar II disorder: Secondary | ICD-10-CM | POA: Diagnosis not present

## 2023-10-10 DIAGNOSIS — F909 Attention-deficit hyperactivity disorder, unspecified type: Secondary | ICD-10-CM | POA: Diagnosis not present

## 2023-10-13 ENCOUNTER — Encounter: Payer: Self-pay | Admitting: General Surgery

## 2023-10-13 ENCOUNTER — Ambulatory Visit: Admitting: General Surgery

## 2023-10-13 VITALS — BP 111/75 | HR 62 | Temp 98.1°F | Resp 12 | Ht 65.0 in | Wt 197.0 lb

## 2023-10-13 DIAGNOSIS — F3181 Bipolar II disorder: Secondary | ICD-10-CM | POA: Diagnosis not present

## 2023-10-13 DIAGNOSIS — S40852A Superficial foreign body of left upper arm, initial encounter: Secondary | ICD-10-CM | POA: Diagnosis not present

## 2023-10-13 NOTE — Patient Instructions (Addendum)
 Take Tylenol 1000 mg before the procedure to help with pain. Will do numbing medication in the area.  Will plan to remove the implant and do stitches that we leave in place for 10-14 days.

## 2023-10-13 NOTE — Progress Notes (Unsigned)
 [facility] History and Physical  Reason for Referral:*** Referring Physician: ***  Chief Complaint   New Patient (Initial Visit)     Julie Wheeler is a 23 y.o. female.  HPI:   Discussed the use of AI scribe software for clinical note transcription with the patient, who gave verbal consent to proceed.  History of Present Illness      ***.  The *** started *** and has had a duration of ***.  It is associated with ***.  The *** is improved with ***, and is made worse with ***.    Quality*** Context***  Past Medical History:  Diagnosis Date   Acid reflux    no current meds.   Onset of menses    LMP 12/19/2011 - has already started periods   Precocious puberty 12/2011    Past Surgical History:  Procedure Laterality Date   CLOSED REDUCTION FIBULA     MRI     sedated for MRI   SUPPRELIN IMPLANT  01/08/2012   Procedure: SUPPRELIN IMPLANT;  Surgeon: Judie Petit. Leonia Corona, MD;  Location: [facility];  Service: Pediatrics;  Laterality: Left;    Family History  Problem Relation Age of Onset   Cancer Brother        hx. neuroblastoma as a child   Cancer Paternal Grandmother    Hypertension Maternal Aunt    Hypertension Maternal Grandfather    Anesthesia problems Maternal Grandmother        post-op N/V    Social History   Tobacco Use   Smoking status: Never    Passive exposure: Yes   Smokeless tobacco: Never   Tobacco comments:    mother smokes inside  Vaping Use   Vaping status: Every Day   Substances: Nicotine  Substance Use Topics   Alcohol use: No   Drug use: No    Medications: {medication reviewed/display:3041432} Allergies as of 10/13/2023       Reactions   Fluoxetine Palpitations   Sulfa Antibiotics Rash        Medication List        Accurate as of October 13, 2023 11:49 AM. If you have any questions, ask your nurse or doctor.          STOP taking these medications    FLUoxetine 10 MG capsule Commonly known as:  PROZAC Stopped by: Lucretia Roers   Norethindrone Acetate-Ethinyl Estrad-FE 1-20 MG-MCG(24) tablet Commonly known as: LOESTRIN 24 FE Stopped by: Lucretia Roers       TAKE these medications    lamoTRIgine 100 MG tablet Commonly known as: LAMICTAL Take 100 mg by mouth daily.   lurasidone 40 MG Tabs tablet Commonly known as: LATUDA Take 40 mg by mouth daily with breakfast.   Phexxi 1.8-1-0.4 % Gel Generic drug: Lactic Ac-Citric Ac-Pot Bitart Insert into vagina prior to intercourse--lasts for one hour   Vyvanse 30 MG capsule Generic drug: lisdexamfetamine Take 30 mg by mouth every morning.         ROS:  {Review of Systems:30496}  Blood pressure 111/75, pulse 62, temperature 98.1 F (36.7 C), temperature source Oral, resp. rate 12, height 5\' 5"  (1.651 m), weight 197 lb (89.4 kg), SpO2 98%. Physical Exam Physical Exam   Results: No results found for this or any previous visit (from the past 48 hours).  No results found.   Assessment and Plan: Assessment and Plan Assessment & Plan      Julie Wheeler is a 23  y.o. female with *** -*** -*** -Follow up ***  All questions were answered to the satisfaction of the patient and family***.  The risk and benefits of *** were discussed including but not limited to ***.  After careful consideration, Julie Wheeler has decided to ***.    Lucretia Roers 10/13/2023, 11:49 AM

## 2023-10-14 DIAGNOSIS — F909 Attention-deficit hyperactivity disorder, unspecified type: Secondary | ICD-10-CM | POA: Diagnosis not present

## 2023-10-26 DIAGNOSIS — F909 Attention-deficit hyperactivity disorder, unspecified type: Secondary | ICD-10-CM | POA: Diagnosis not present

## 2023-11-02 DIAGNOSIS — F909 Attention-deficit hyperactivity disorder, unspecified type: Secondary | ICD-10-CM | POA: Diagnosis not present

## 2023-11-03 DIAGNOSIS — F902 Attention-deficit hyperactivity disorder, combined type: Secondary | ICD-10-CM | POA: Diagnosis not present

## 2023-11-09 DIAGNOSIS — F909 Attention-deficit hyperactivity disorder, unspecified type: Secondary | ICD-10-CM | POA: Diagnosis not present

## 2023-11-16 DIAGNOSIS — F909 Attention-deficit hyperactivity disorder, unspecified type: Secondary | ICD-10-CM | POA: Diagnosis not present

## 2023-11-18 DIAGNOSIS — F909 Attention-deficit hyperactivity disorder, unspecified type: Secondary | ICD-10-CM | POA: Diagnosis not present

## 2023-11-19 DIAGNOSIS — W57XXXA Bitten or stung by nonvenomous insect and other nonvenomous arthropods, initial encounter: Secondary | ICD-10-CM | POA: Diagnosis not present

## 2023-11-19 DIAGNOSIS — S40262A Insect bite (nonvenomous) of left shoulder, initial encounter: Secondary | ICD-10-CM | POA: Diagnosis not present

## 2023-11-24 DIAGNOSIS — F909 Attention-deficit hyperactivity disorder, unspecified type: Secondary | ICD-10-CM | POA: Diagnosis not present

## 2023-11-25 ENCOUNTER — Encounter: Payer: Self-pay | Admitting: General Surgery

## 2023-11-25 ENCOUNTER — Ambulatory Visit (INDEPENDENT_AMBULATORY_CARE_PROVIDER_SITE_OTHER): Admitting: General Surgery

## 2023-11-25 VITALS — BP 102/68 | HR 72 | Temp 97.8°F | Resp 14 | Ht 65.0 in | Wt 195.0 lb

## 2023-11-25 DIAGNOSIS — S40852A Superficial foreign body of left upper arm, initial encounter: Secondary | ICD-10-CM | POA: Diagnosis not present

## 2023-11-25 DIAGNOSIS — L942 Calcinosis cutis: Secondary | ICD-10-CM | POA: Diagnosis not present

## 2023-11-25 MED ORDER — OXYCODONE HCL 5 MG PO TABS: 5.0000 mg | ORAL_TABLET | ORAL | 0 refills | Status: DC | PRN

## 2023-11-25 NOTE — Patient Instructions (Signed)
 Discharge Instructions:  Common Complaints: Pain at the incision site is common. Bruising is common.  Activity: Shower per your regular routine daily.  Do not take hot showers as this can disrupt the glue.  Limit excessive movement, stretching with the limb if there is an incision on your arm/armpit or leg.   Limit stretching, pulling on your incision if it is located on other parts of your body.  Do not pick at the dermabond glue on your incision sites. This glue film will remain in place for 1-2 weeks and will start to peel off.  Do not place lotions or balms on your incision unless instructed to specifically by Dr. Collene Dawson.   Medication: Take tylenol  and ibuprofen  as needed for pain control, alternating every 4-6 hours.  Example:  Tylenol  1000mg  @ 6am, 12noon, 6pm, (Do not exceed 4000mg  of tylenol  a day). Ibuprofen  800mg  @ 9am, 3pm, 9pm, 3am (Do not exceed 3600mg  of ibuprofen  a day).  Take Roxicodone for breakthrough pain.  Take Colace for constipation related to narcotic pain medication. If you do not have a bowel movement in 2 days, take Miralax over the counter.  Drink plenty of water to also prevent constipation.   Contact Information: If you have questions or concerns, please call our office, [phone], Monday- Thursday 8AM-5PM and Friday 8AM-12Noon.  If it is after hours or on the weekend, please call [facility] Main Number, [phone], [phone] and ask to speak to the surgeon on call for Dr. Collene Dawson at [facility].

## 2023-11-25 NOTE — Progress Notes (Signed)
 [facility] Procedure Note  11/25/23  Pre-procedure Diagnosis:  Supprelin  Implant Left Arm    Post-procedure Diagnosis: Same   Procedure(s) Performed: Excision of the Implant /Foreign Body    Surgeon: Dixon Fredrickson. Collene Dawson, MD   Assistants: No qualified resident was available    Anesthesia: Lidocaine  1%    Specimens:  Removal of foreign body in 3 pieces    Estimated Blood Loss: Minimal  Wound Class: Clean    Procedure Indications: Julie Wheeler is a 23 yo who had a  Supprelin  placed at age 20 and needs this removed. GYN has attempted removal and says the foreign body was falling apart. We discussed excision and risk of bleeding, infection, incomplete removal, not improving discomfort or pain.   Findings: Clear implant with some opaque central portion, removed in 3 pieces    Procedure: The patient was taken to the procedure room and placed supine. The left upper arm on the underside was prepared and draped in the usual sterile fashion. Lidocaine  1% was injected over the area.   An incision was made over the palpable foreign body. This was carried down to the adipose. I excised the implant with sharp dissection with scissors, removing some adherent adipose tissue and scar. The implant came out in three pieces. This was all sent to pathology. I palpated the area and felt no further foreign body. I looked at the three pieces and it was the size and shape expected based on google Image search.  The cavity was irrigated. Vicryl 3-0 was placed to close the space. The skin was closed with 4-0 Monocryl and dermabond.   Final inspection revealed acceptable hemostasis. The patient tolerated the procedure well.   Future Appointments  Date Time Provider Department Center  12/08/2023 12:05 PM Awilda Bogus, MD [facility] None   Discharge Instructions:  Common Complaints: Pain at the incision site is common. Bruising is common.  Activity: Shower per your regular routine daily.   Do not take hot showers as this can disrupt the glue.  Limit excessive movement, stretching with the limb if there is an incision on your arm/armpit or leg.   Limit stretching, pulling on your incision if it is located on other parts of your body.  Do not pick at the dermabond glue on your incision sites. This glue film will remain in place for 1-2 weeks and will start to peel off.  Do not place lotions or balms on your incision unless instructed to specifically by Dr. Collene Dawson.   Medication: Take tylenol  and ibuprofen  as needed for pain control, alternating every 4-6 hours.  Example:  Tylenol  1000mg  @ 6am, 12noon, 6pm, (Do not exceed 4000mg  of tylenol  a day). Ibuprofen  800mg  @ 9am, 3pm, 9pm, 3am (Do not exceed 3600mg  of ibuprofen  a day).  Take Roxicodone for breakthrough pain.  Take Colace for constipation related to narcotic pain medication. If you do not have a bowel movement in 2 days, take Miralax over the counter.  Drink plenty of water to also prevent constipation.   Contact Information: If you have questions or concerns, please call our office, [phone], Monday- Thursday 8AM-5PM and Friday 8AM-12Noon.  If it is after hours or on the weekend, please call [facility], [phone], [phone] and ask to speak to the surgeon on call for Dr. Collene Dawson at [facility].    Deena Farrier, MD [facility] [address] [place], [place] [address] [phone] (office)

## 2023-11-26 ENCOUNTER — Ambulatory Visit: Admitting: General Surgery

## 2023-11-30 DIAGNOSIS — F902 Attention-deficit hyperactivity disorder, combined type: Secondary | ICD-10-CM | POA: Diagnosis not present

## 2023-12-01 ENCOUNTER — Ambulatory Visit (INDEPENDENT_AMBULATORY_CARE_PROVIDER_SITE_OTHER): Admitting: General Surgery

## 2023-12-01 DIAGNOSIS — S40852A Superficial foreign body of left upper arm, initial encounter: Secondary | ICD-10-CM

## 2023-12-01 NOTE — Progress Notes (Signed)
 I am guessing that the pathology department did not get the information about there being the implant in the specimen? Can we reach out to them and make sure they know that we removed an implant. The last comment says "intradepartmental review: [facility]."

## 2023-12-02 LAB — PATHOLOGY REPORT

## 2023-12-02 LAB — TISSUE SPECIMEN

## 2023-12-02 NOTE — Progress Notes (Signed)
 Foreign body and adipose as expected.

## 2023-12-03 NOTE — Progress Notes (Signed)
Tried to call patient no answer and no vm.

## 2023-12-08 ENCOUNTER — Ambulatory Visit (INDEPENDENT_AMBULATORY_CARE_PROVIDER_SITE_OTHER): Admitting: General Surgery

## 2023-12-08 DIAGNOSIS — S40852A Superficial foreign body of left upper arm, initial encounter: Secondary | ICD-10-CM

## 2023-12-08 NOTE — Progress Notes (Signed)
 [facility]  I am calling the patient for post operative evaluation. This is not a billable encounter as it is under the global charges for the surgery.  The patient had a supprelin  implant on 11/25/23. The patient reports that she is doing well.   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 - Gross only: tube-like foreign object (see gross description).   Will see the patient PRN.   Deena Farrier, MD [facility] [address] [place], [place] [address] [phone] (office)

## 2023-12-16 DIAGNOSIS — F909 Attention-deficit hyperactivity disorder, unspecified type: Secondary | ICD-10-CM | POA: Diagnosis not present

## 2024-01-20 DIAGNOSIS — F603 Borderline personality disorder: Secondary | ICD-10-CM | POA: Diagnosis not present

## 2024-01-20 DIAGNOSIS — F909 Attention-deficit hyperactivity disorder, unspecified type: Secondary | ICD-10-CM | POA: Diagnosis not present

## 2024-01-20 DIAGNOSIS — F17290 Nicotine dependence, other tobacco product, uncomplicated: Secondary | ICD-10-CM | POA: Diagnosis not present

## 2024-01-20 DIAGNOSIS — F3181 Bipolar II disorder: Secondary | ICD-10-CM | POA: Diagnosis not present

## 2024-01-20 DIAGNOSIS — F332 Major depressive disorder, recurrent severe without psychotic features: Secondary | ICD-10-CM | POA: Diagnosis not present

## 2024-01-20 DIAGNOSIS — R002 Palpitations: Secondary | ICD-10-CM | POA: Diagnosis not present

## 2024-01-22 DIAGNOSIS — F909 Attention-deficit hyperactivity disorder, unspecified type: Secondary | ICD-10-CM | POA: Diagnosis not present

## 2024-01-30 DIAGNOSIS — F902 Attention-deficit hyperactivity disorder, combined type: Secondary | ICD-10-CM | POA: Diagnosis not present

## 2024-02-11 DIAGNOSIS — F902 Attention-deficit hyperactivity disorder, combined type: Secondary | ICD-10-CM | POA: Diagnosis not present

## 2024-02-19 DIAGNOSIS — F603 Borderline personality disorder: Secondary | ICD-10-CM | POA: Diagnosis not present

## 2024-02-19 DIAGNOSIS — F50819 Binge eating disorder, unspecified: Secondary | ICD-10-CM | POA: Diagnosis not present

## 2024-02-19 DIAGNOSIS — F909 Attention-deficit hyperactivity disorder, unspecified type: Secondary | ICD-10-CM | POA: Diagnosis not present

## 2024-02-19 DIAGNOSIS — F3181 Bipolar II disorder: Secondary | ICD-10-CM | POA: Diagnosis not present

## 2024-03-21 DIAGNOSIS — F603 Borderline personality disorder: Secondary | ICD-10-CM | POA: Diagnosis not present

## 2024-04-12 ENCOUNTER — Emergency Department (HOSPITAL_COMMUNITY)

## 2024-04-12 ENCOUNTER — Emergency Department (HOSPITAL_COMMUNITY)
Admission: EM | Admit: 2024-04-12 | Discharge: 2024-04-12 | Disposition: A | Discharge status: Home / Self Care | Attending: Emergency Medicine | Admitting: Emergency Medicine

## 2024-04-12 ENCOUNTER — Other Ambulatory Visit: Payer: Self-pay

## 2024-04-12 ENCOUNTER — Encounter (HOSPITAL_COMMUNITY): Payer: Self-pay | Admitting: *Deleted

## 2024-04-12 DIAGNOSIS — W228XXA Striking against or struck by other objects, initial encounter: Secondary | ICD-10-CM | POA: Diagnosis not present

## 2024-04-12 DIAGNOSIS — M542 Cervicalgia: Secondary | ICD-10-CM | POA: Diagnosis not present

## 2024-04-12 DIAGNOSIS — S0990XA Unspecified injury of head, initial encounter: Secondary | ICD-10-CM | POA: Insufficient documentation

## 2024-04-12 LAB — POC URINE PREG, ED: Preg Test, Ur: NEGATIVE

## 2024-04-12 NOTE — ED Provider Notes (Signed)
 [place] EMERGENCY DEPARTMENT AT [facility] Provider Note   CSN: [id number] Arrival date & time: 04/12/24  1153     Patient presents with: Head Injury   Julie Wheeler is a 23 y.o. female.   Patient is a 23 year old female who presents emergency department the chief complaint of worsening headache.  She notes that last night she hit her head on her bed.  She notes that she did get a worsening headache last night which became worse this morning with associated nausea.  She has had no associated vomiting.  She denies any known history of blood disorders or current anticoagulation.  She notes that when she awoke this morning she was also feeling as though she was having palpitations but this has completely resolved.  She denies any vision or hearing changes.  She denies any numbness, paresthesias or unilateral weakness.  She does admit to some mild pain to the superior aspect of her neck.   Head Injury Associated symptoms: headache        Prior to Admission medications   Medication Sig Start Date End Date Taking? Authorizing Provider  Lactic Ac-Citric Ac-Pot Bitart (PHEXXI ) 1.8-1-0.4 % GEL Insert into vagina prior to intercourse--lasts for one hour 09/17/23   Cresenzo-Dishmon, Cathlean, CNM  lamoTRIgine (LAMICTAL) 100 MG tablet Take 100 mg by mouth daily. 09/30/23   [provider]  oxyCODONE  (ROXICODONE ) 5 MG immediate release tablet Take 1 tablet (5 mg total) by mouth every 4 (four) hours as needed for severe pain (pain score 7-10) or breakthrough pain. 11/25/23   Kallie Manuelita BROCKS, MD  VYVANSE 30 MG capsule Take 30 mg by mouth every morning. 09/24/23   [provider]    Allergies: Fluoxetine and Sulfa antibiotics    Review of Systems  Neurological:  Positive for headaches.  All other systems reviewed and are negative.   Updated Vital Signs BP 124/68 (BP Location: Right Arm)   Pulse 64   Temp 98.1 F (36.7 C) (Oral)   Resp 18   Ht 5' 5 (1.651 m)    Wt 81.6 kg   LMP 02/16/2024   SpO2 100%   BMI 29.95 kg/m   Physical Exam Vitals and nursing note reviewed.  Constitutional:      General: She is not in acute distress.    Appearance: Normal appearance. She is not ill-appearing.  HENT:     Head: Normocephalic and atraumatic.     Nose: Nose normal.     Mouth/Throat:     Mouth: Mucous membranes are moist.  Eyes:     Extraocular Movements: Extraocular movements intact.     Conjunctiva/sclera: Conjunctivae normal.     Pupils: Pupils are equal, round, and reactive to light.  Neck:     Comments: Mild tenderness along superior C-spine, no step-off or deformity Cardiovascular:     Rate and Rhythm: Normal rate and regular rhythm.     Pulses: Normal pulses.     Heart sounds: Normal heart sounds. No murmur heard.    No gallop.  Pulmonary:     Effort: Pulmonary effort is normal. No respiratory distress.     Breath sounds: Normal breath sounds. No stridor. No wheezing, rhonchi or rales.  Chest:     Chest wall: No tenderness.  Musculoskeletal:        General: No swelling, tenderness, deformity or signs of injury. Normal range of motion.     Cervical back: Normal range of motion and neck supple. No rigidity.  Skin:  General: Skin is warm and dry.     Findings: No rash.  Neurological:     General: No focal deficit present.     Mental Status: She is alert and oriented to person, place, and time. Mental status is at baseline.     Cranial Nerves: No cranial nerve deficit.     Sensory: No sensory deficit.     Motor: No weakness.     Coordination: Coordination normal.     Gait: Gait normal.  Psychiatric:        Mood and Affect: Mood normal.        Behavior: Behavior normal.        Thought Content: Thought content normal.        Judgment: Judgment normal.     (all labs ordered are listed, but only abnormal results are displayed) Labs Reviewed  POC URINE PREG, ED    EKG: None  Radiology: No results found.   Procedures    Medications Ordered in the ED - No data to display                                  Medical Decision Making Patient is doing well at this time and is stable for discharge home.  Discussed with patient CT scan of the head and cervical spine were unremarked for any signs of acute traumatic injury.  Suspect close head injury at this point.  Patient has no associated lacerations or abrasions.  She had no other long bone or joint pain noted on exam.  She has had no chest pain, shortness of breath, abdominal pain.  Do not suspect that any further workup is warranted at this time.  Close follow-up PCP was discussed as well as strict return precautions for any new or worsening symptoms.  Patient voiced understanding and had no additional questions.  Amount and/or Complexity of Data Reviewed Radiology: ordered.        Final diagnoses:  None    ED Discharge Orders     None          Daralene Lonni JONETTA DEVONNA 04/12/24 1519    Suzette Pac, MD 04/13/24 1642

## 2024-04-12 NOTE — Discharge Instructions (Signed)
 Please follow-up closely with your primary care doctor on an outpatient basis.  Return to emergency department immediately for any new or worsening symptoms.

## 2024-04-12 NOTE — ED Triage Notes (Addendum)
 Pt hit her head at right temporal yesterday on a built in shelf.  Denies any LOC.  Pt woke up palpitations and nausea-which has improved at present, denies emesis.  +HA Denies blurred vision, + little unsteady last night. Denies blood thinners.

## 2024-05-09 DIAGNOSIS — R002 Palpitations: Secondary | ICD-10-CM | POA: Diagnosis not present

## 2024-05-09 DIAGNOSIS — E66811 Obesity, class 1: Secondary | ICD-10-CM | POA: Diagnosis not present

## 2024-05-09 DIAGNOSIS — I491 Atrial premature depolarization: Secondary | ICD-10-CM | POA: Diagnosis not present

## 2024-05-09 DIAGNOSIS — F32A Depression, unspecified: Secondary | ICD-10-CM | POA: Diagnosis not present

## 2024-05-09 DIAGNOSIS — Z6831 Body mass index (BMI) 31.0-31.9, adult: Secondary | ICD-10-CM | POA: Diagnosis not present

## 2024-05-09 NOTE — Progress Notes (Signed)
 Primary Care Physician: Camie JAYSON Mirza, PA  Reason for visit: Palpitation  HPI:  This is pleasant 23 y.o. yrs old female with history of depression, obesity with BMI of 31.3 is here for evaluation of palpitation.  For last few weeks, she has been having palpitation which comes in the form of feeling of a skipped heartbeat especially at nighttime when she lays down.  There is no associated lightheadedness or dizziness.  No prior syncopal events.   In addition to palpitation, she gets occasional sharp discomfort, midsternal lasting for few seconds, at rest, nonradiating.   No history of hypertension or diabetes or hyperlipidemia.  She does have depression, ADHD currently is taking Lamictal, Vyvanse and Seroquel. She says her uncle had heart attack in his 82s.  There is no family history of sudden cardiac death or premature coronary artery disease.  EKG shows sinus rhythm with no ischemic and ectopics. She works as a insurance underwriter. CARDIOVASCULAR WORK UP  ECG 12 lead Result Date: 01/20/2024 Normal EKG  pt aware    Allergies: Prozac [fluoxetine] and Sulfa antibiotics  Past Medical History: Past Medical History:  Diagnosis Date  . Depression    Past Surgical History:  Procedure Laterality Date  . Leg surgery     left leg 2 surgeries   Family History: Family History  Problem Relation Age of Onset  . Diabetes Mother   . Heart disease Paternal Uncle    Social History: Social History[1]  Medication list:     Medication Sig Dispense Refill  . ergocalciferol (VITAMIN D2) 50,000 units CAPS capsule Take one capsule (50,000 Units dose) by mouth once a week. Once a week for 12 weeks 12 capsule 0  . lamoTRIgine (LAMICTAL) 150 mg tablet Take one tablet (150 mg dose) by mouth daily.    . QUEtiapine fumarate (SEROQUEL) 50 mg tablet Take by mouth.    SABRA VYVANSE 30 MG capsule Take one capsule (30 mg dose) by mouth every morning.     No current facility-administered medications for this  visit.    ROS:The patient denies nausea, vomiting, diarrhea, fever, chills, or bleeding. All other systems are reviewed and are negative except for that mentioned in the history of present illness.  No results found for: [facility]  Lab Results  Component Value Date   WBC 7.2 01/20/2024   Hemoglobin 13.3 01/20/2024   Hematocrit 42.0 01/20/2024   MCV 90 01/20/2024   Platelet Count 348 01/20/2024    Lab Results  Component Value Date   Creatinine 0.84 01/20/2024   BUN 10 01/20/2024   Sodium 143 01/20/2024   Potassium 4.8 01/20/2024   Chloride 104 01/20/2024   CO2 18 (L) 01/20/2024   Lab Results  Component Value Date   TSH 2.570 01/20/2024    Results for orders placed or performed in visit on 01/20/24  Hemoglobin A1c  Result Value Ref Range   Hemoglobin A1c 4.9 4.8 - 5.6 %   Narrative   Performed at:  [address] [facility] [address] [place], [place]  [id number] Lab Director: Frankey Sas MD, Phone:  [phone]    Physical Exam:   Vital Signs: BP 123/67 (BP Location: Left Upper Arm, Patient Position: Sitting)   Ht 5' 5.5 (1.664 m)   Wt 191 lb (86.6 kg)   BMI 31.30 kg/m  Constitutional: Well-nourished well-developed ,  no acute distress.  HENT:normocephalic,atraumatic,oropharynx moist,no oral exudates.   Neck: Supple. No JVD. Carotid bruits: Absent bilaterally. Eyes: Conjunctiva normal, no discharge. Lymphatic:no lymphadenopathy noted.  Cardiovascular: RRR. No murmur, No gallop. No rub. Respiratory: Mildly decreased breath sounds bilaterally.  No wheezes or crackles noted GI: Soft, nontender, nondistended, with normal bowel sounds, no masses or hepatosplenomegaly.  Skin: Warm, dry, no erythema, no rash. Musculoskeletal: No edema, no tenderness, no cyanosis, no clubbing. Pulses: 1+ and intact bilaterally Neurological:  Cranial nerves are intact with no focal deficits. Psychiatric: Alert and oriented to person, place and time, normal affect.  IMPRESSION AND  PLAN    1. Palpitations   2. PAC (premature atrial contraction)   3. Class 1 obesity with body mass index (BMI) of 31.0 to 31.9 in adult, unspecified obesity type, unspecified whether serious comorbidity present   4. Depression, unspecified depression type    Plan:  Symptoms could be secondary to PACs/PVCs.  Request an echocardiogram and monitor for 2 weeks. Cardiac examination is benign.  EKG is normal.   Follow up in about 1 year (around 05/09/2025).   Marcello MARLA Lennox, MD   Note: This documented was generated using voice recognition software. There may be unintended transcription errors that were not detected upon document review.            [1] Social History Socioeconomic History  . Marital status: Single  Tobacco Use  . Smoking status: Former    Average packs/day: 0.3 packs/day for 0.9 years (0.2 ttl pk-yrs)    Types: Cigarettes    Start date: 07/14/2018  . Smokeless tobacco: Never  Vaping Use  . Vaping status: Every Day  . Substances: Nicotine  . Devices: Disposable  Substance and Sexual Activity  . Alcohol use: Yes  . Sexual activity: Yes    Partners: Male

## 2024-05-16 ENCOUNTER — Ambulatory Visit (INDEPENDENT_AMBULATORY_CARE_PROVIDER_SITE_OTHER): Admitting: Obstetrics & Gynecology

## 2024-05-16 ENCOUNTER — Encounter: Payer: Self-pay | Admitting: Obstetrics & Gynecology

## 2024-05-16 ENCOUNTER — Other Ambulatory Visit (HOSPITAL_COMMUNITY)
Admission: RE | Admit: 2024-05-16 | Discharge: 2024-05-16 | Disposition: A | Discharge status: Home / Self Care | Source: Ambulatory Visit | Attending: Obstetrics & Gynecology | Admitting: Obstetrics & Gynecology

## 2024-05-16 VITALS — BP 107/72 | HR 83 | Ht 65.0 in | Wt 191.8 lb

## 2024-05-16 DIAGNOSIS — Z124 Encounter for screening for malignant neoplasm of cervix: Secondary | ICD-10-CM | POA: Insufficient documentation

## 2024-05-16 DIAGNOSIS — Z113 Encounter for screening for infections with a predominantly sexual mode of transmission: Secondary | ICD-10-CM | POA: Diagnosis not present

## 2024-05-16 DIAGNOSIS — Z30011 Encounter for initial prescription of contraceptive pills: Secondary | ICD-10-CM

## 2024-05-16 DIAGNOSIS — Z1331 Encounter for screening for depression: Secondary | ICD-10-CM | POA: Diagnosis not present

## 2024-05-16 MED ORDER — NORETHIN ACE-ETH ESTRAD-FE 1-20 MG-MCG(24) PO TABS: 1.0000 | ORAL_TABLET | Freq: Every day | ORAL | 4 refills | Status: AC

## 2024-05-16 NOTE — Progress Notes (Signed)
   GYN VISIT Patient name: Julie Wheeler MRN [id number]  Date of birth: [birth year] Chief Complaint:   irregular period (Bled 2 weeks last month/Also had needle stick at tattoo shop)  History of Present Illness:   Julie Wheeler is a 23 y.o. G0P0000 female being seen today for the following concerns:  Menses are typically regular each month and typically last for about a week.  Usually heavy x 1 day then light x 6 days.  Denies vaginal discharge, itching or irritation.  Denies pelvic or abdominal pain.    Patient's last menstrual period was 05/14/2024.    Review of Systems:   Pertinent items are noted in HPI Denies fever/chills, dizziness, headaches, visual disturbances, fatigue, shortness of breath, chest pain, abdominal pain, vomiting Pertinent History Reviewed:   Past Surgical History:  Procedure Laterality Date   CLOSED REDUCTION FIBULA     MRI     sedated for MRI   SUPPRELIN  IMPLANT  01/08/2012   Procedure: SUPPRELIN  IMPLANT;  Surgeon: CHRISTELLA. Julietta Millman, MD;  Location: [facility];  Service: Pediatrics;  Laterality: Left;    Past Medical History:  Diagnosis Date   Acid reflux    no current meds.   Onset of menses    LMP 12/19/2011 - has already started periods   Precocious puberty 12/2011   Reviewed problem list, medications and allergies. Physical Assessment:   Vitals:   05/16/24 1433  BP: 107/72  Pulse: 83  Weight: 191 lb 12.8 oz (87 kg)  Height: 5' 5 (1.651 m)  Body mass index is 31.92 kg/m.       Physical Examination:   General appearance: alert, well appearing, and in no distress  Psych: mood appropriate, normal affect  Skin: warm & dry   Cardiovascular: normal heart rate noted  Respiratory: normal respiratory effort, no distress  Abdomen: soft, non-tender   Pelvic: VULVA: normal appearing vulva with no masses, tenderness or lesions, VAGINA: normal appearing vagina with normal color and discharge, no lesions, CERVIX: normal appearing  cervix without discharge or lesions, UTERUS: uterus is normal size, shape, consistency and nontender, ADNEXA: normal adnexa in size, nontender and no masses  Extremities: no edema   Chaperone: Kathrine Melena, PGY2    Assessment & Plan:  1) Contraceptive management -reviewed all contraceptive options including pills, patch, ring, Depot or LARCs -risk/benefit and potential side effects of each were reviewed -questions and concerns were addressed, pt desires to proceed with pill  OCP risk assessment: Pt denies personal history of VTE, stroke or heart attack.  Denies personal h/o breast cancer.  Pt is either a non-smoker or smoker under the age of 22yo.  Denies h/o migraines with aura  2) STI screening- to be completed  3) pap collected- reviewed screening guidelines  Orders Placed This Encounter  Procedures   HIV Antibody (routine testing w rflx)   RPR   Hepatitis C antibody   Hepatitis B surface antigen    Return in about 3 months (around 08/16/2024) for Medication follow up.   Henri Baumler, DO Attending Obstetrician & Gynecologist, [facility] for [employer], [facility]

## 2024-05-17 ENCOUNTER — Ambulatory Visit: Payer: Self-pay | Admitting: Obstetrics & Gynecology

## 2024-05-17 LAB — HIV ANTIBODY (ROUTINE TESTING W REFLEX): HIV Screen 4th Generation wRfx: NONREACTIVE

## 2024-05-17 LAB — RPR: RPR Ser Ql: NONREACTIVE

## 2024-05-17 LAB — HEPATITIS B SURFACE ANTIGEN: Hepatitis B Surface Ag: NEGATIVE

## 2024-05-17 LAB — HEPATITIS C ANTIBODY: Hep C Virus Ab: NONREACTIVE

## 2024-05-20 LAB — CYTOLOGY - PAP
Chlamydia: NEGATIVE
Comment: NEGATIVE
Comment: NORMAL
Diagnosis: NEGATIVE
Diagnosis: REACTIVE
Neisseria Gonorrhea: NEGATIVE

## 2024-05-23 DIAGNOSIS — E66811 Obesity, class 1: Secondary | ICD-10-CM | POA: Diagnosis not present

## 2024-05-23 DIAGNOSIS — F32A Depression, unspecified: Secondary | ICD-10-CM | POA: Diagnosis not present

## 2024-05-23 DIAGNOSIS — I491 Atrial premature depolarization: Secondary | ICD-10-CM | POA: Diagnosis not present

## 2024-05-23 DIAGNOSIS — R002 Palpitations: Secondary | ICD-10-CM | POA: Diagnosis not present

## 2024-05-23 DIAGNOSIS — Z6831 Body mass index (BMI) 31.0-31.9, adult: Secondary | ICD-10-CM | POA: Diagnosis not present

## 2024-06-24 DIAGNOSIS — Z6831 Body mass index (BMI) 31.0-31.9, adult: Secondary | ICD-10-CM | POA: Diagnosis not present

## 2024-06-24 DIAGNOSIS — E66811 Obesity, class 1: Secondary | ICD-10-CM | POA: Diagnosis not present

## 2024-06-24 DIAGNOSIS — F32A Depression, unspecified: Secondary | ICD-10-CM | POA: Diagnosis not present

## 2024-06-24 DIAGNOSIS — I491 Atrial premature depolarization: Secondary | ICD-10-CM | POA: Diagnosis not present

## 2024-06-24 DIAGNOSIS — R002 Palpitations: Secondary | ICD-10-CM | POA: Diagnosis not present

## 2024-06-26 ENCOUNTER — Other Ambulatory Visit: Payer: Self-pay

## 2024-06-26 ENCOUNTER — Emergency Department (HOSPITAL_COMMUNITY)
Admission: EM | Admit: 2024-06-26 | Discharge: 2024-06-26 | Disposition: A | Discharge status: Home / Self Care | Attending: Emergency Medicine | Admitting: Emergency Medicine

## 2024-06-26 ENCOUNTER — Encounter (HOSPITAL_COMMUNITY): Payer: Self-pay | Admitting: Emergency Medicine

## 2024-06-26 DIAGNOSIS — Z760 Encounter for issue of repeat prescription: Secondary | ICD-10-CM | POA: Diagnosis not present

## 2024-06-26 DIAGNOSIS — R1110 Vomiting, unspecified: Secondary | ICD-10-CM | POA: Insufficient documentation

## 2024-06-26 LAB — CBC WITH DIFFERENTIAL/PLATELET
Abs Immature Granulocytes: 0.03 K/uL (ref 0.00–0.07)
Basophils Absolute: 0.1 K/uL (ref 0.0–0.1)
Basophils Relative: 1 %
Eosinophils Absolute: 0 K/uL (ref 0.0–0.5)
Eosinophils Relative: 0 %
HCT: 41.3 % (ref 36.0–46.0)
Hemoglobin: 13.4 g/dL (ref 12.0–15.0)
Immature Granulocytes: 0 %
Lymphocytes Relative: 17 %
Lymphs Abs: 1.8 K/uL (ref 0.7–4.0)
MCH: 28.1 pg (ref 26.0–34.0)
MCHC: 32.4 g/dL (ref 30.0–36.0)
MCV: 86.6 fL (ref 80.0–100.0)
Monocytes Absolute: 0.5 K/uL (ref 0.1–1.0)
Monocytes Relative: 5 %
Neutro Abs: 8.2 K/uL — ABNORMAL HIGH (ref 1.7–7.7)
Neutrophils Relative %: 77 %
Platelets: 358 K/uL (ref 150–400)
RBC: 4.77 MIL/uL (ref 3.87–5.11)
RDW: 12.2 % (ref 11.5–15.5)
WBC: 10.5 K/uL (ref 4.0–10.5)
nRBC: 0 % (ref 0.0–0.2)

## 2024-06-26 LAB — COMPREHENSIVE METABOLIC PANEL WITH GFR
ALT: 30 U/L (ref 0–44)
AST: 25 U/L (ref 15–41)
Albumin: 4.9 g/dL (ref 3.5–5.0)
Alkaline Phosphatase: 63 U/L (ref 38–126)
Anion gap: 10 (ref 5–15)
BUN: 8 mg/dL (ref 6–20)
CO2: 26 mmol/L (ref 22–32)
Calcium: 9.6 mg/dL (ref 8.9–10.3)
Chloride: 102 mmol/L (ref 98–111)
Creatinine, Ser: 0.83 mg/dL (ref 0.44–1.00)
GFR, Estimated: >60 mL/min (ref >60)
Glucose, Bld: 81 mg/dL (ref 70–99)
Potassium: 4.1 mmol/L (ref 3.5–5.1)
Sodium: 139 mmol/L (ref 135–145)
Total Bilirubin: 0.7 mg/dL (ref 0.0–1.2)
Total Protein: 7.9 g/dL (ref 6.5–8.1)

## 2024-06-26 LAB — HCG, QUANTITATIVE, PREGNANCY: hCG, Beta Chain, Quant, S: <1 m[IU]/mL (ref <5)

## 2024-06-26 MED ORDER — QUETIAPINE FUMARATE 50 MG PO TABS: 50.0000 mg | ORAL_TABLET | Freq: Every day | ORAL | 1 refills | Status: AC

## 2024-06-26 MED ORDER — SODIUM CHLORIDE 0.9 % IV BOLUS
1000.0000 mL | Freq: Once | INTRAVENOUS | Status: AC
  Administered 2024-06-26: 1000 mL via INTRAVENOUS

## 2024-06-26 MED ORDER — ONDANSETRON HCL 4 MG/2ML IJ SOLN
4.0000 mg | Freq: Once | INTRAMUSCULAR | Status: AC
  Administered 2024-06-26: 4 mg via INTRAVENOUS
  Filled 2024-06-26: qty 2

## 2024-06-26 MED ORDER — ONDANSETRON 4 MG PO TBDP: ORAL_TABLET | ORAL | 0 refills | Status: AC

## 2024-06-26 NOTE — ED Provider Notes (Signed)
 [place] EMERGENCY DEPARTMENT AT [facility] Provider Note   CSN: [id number] Arrival date & time: 06/26/24  9391     Patient presents with: Medication Refill   Julie Wheeler is a 23 y.o. female.  {Add pertinent medical, surgical, social history, OB history to YEP:67052} Patient has missed her Seroquel  the last 2 days.  She tried to get a refill but her doctor has not put the refill in yet.  She has been having some vomiting today.  She vomited at least 3 times.  No abdominal pain   Medication Refill      Prior to Admission medications  Medication Sig Start Date End Date Taking? Authorizing Provider  ondansetron  (ZOFRAN -ODT) 4 MG disintegrating tablet 4mg  ODT q4 hours prn nausea/vomit 06/26/24  Yes Darek Eifler, MD  QUEtiapine  (SEROQUEL ) 50 MG tablet Take 1 tablet (50 mg total) by mouth at bedtime. 06/26/24  Yes Suzette Pac, MD  Lactic Ac-Citric Ac-Pot Bitart (PHEXXI ) 1.8-1-0.4 % GEL Insert into vagina prior to intercourse--lasts for one hour Patient not taking: Reported on 05/16/2024 09/17/23   Cresenzo-Dishmon, Cathlean, CNM  lamoTRIgine (LAMICTAL) 150 MG tablet Take 150 mg by mouth daily.    [provider]  Norethindrone Acetate-Ethinyl Estrad-FE (LOESTRIN 24 FE) 1-20 MG-MCG(24) tablet Take 1 tablet by mouth daily. 05/16/24 08/14/24  Ozan, Jennifer, DO  VYVANSE 40 MG capsule Take 40 mg by mouth every morning. 04/18/24   [provider]    Allergies: Fluoxetine and Sulfa antibiotics    Review of Systems  Updated Vital Signs BP 118/83   Pulse 62   Temp 98 F (36.7 C) (Oral)   Resp 16   Ht 5' 5 (1.651 m)   Wt 87 kg   SpO2 100%   BMI 31.92 kg/m   Physical Exam  (all labs ordered are listed, but only abnormal results are displayed) Labs Reviewed  CBC WITH DIFFERENTIAL/PLATELET - Abnormal; Notable for the following components:      Result Value   Neutro Abs 8.2 (*)    All other components within normal limits  COMPREHENSIVE METABOLIC  PANEL WITH GFR  HCG, QUANTITATIVE, PREGNANCY    EKG: None  Radiology: No results found.  {Document cardiac monitor, telemetry assessment procedure when appropriate:32947} Procedures   Medications Ordered in the ED  sodium chloride  0.9 % bolus 1,000 mL (1,000 mLs Intravenous New Bag/Given 06/26/24 0855)  ondansetron  (ZOFRAN ) injection 4 mg (4 mg Intravenous Given 06/26/24 0856)      {Click here for ABCD2, HEART and other calculators REFRESH Note before signing:1}                              Medical Decision Making Amount and/or Complexity of Data Reviewed Labs: ordered.  Risk Prescription drug management.  Patient with vomiting possibly related to not taking her Seroquel .  Labs unremarkable.  Patient will be discharged home with her Seroquel  and Zofran  and follow-up with PCP  {Document critical care time when appropriate  Document review of labs and clinical decision tools ie CHADS2VASC2, etc  Document your independent review of radiology images and any outside records  Document your discussion with family members, caretakers and with consultants  Document social determinants of health affecting pt's care  Document your decision making why or why not admission, treatments were needed:32947:::1}   Final diagnoses:  Medication refill  Acute vomiting    ED Discharge Orders  Ordered    QUEtiapine  (SEROQUEL ) 50 MG tablet  Daily at bedtime        06/26/24 0947    ondansetron  (ZOFRAN -ODT) 4 MG disintegrating tablet        06/26/24 0947

## 2024-06-26 NOTE — ED Triage Notes (Signed)
 Pt arrived to ED c/o being out of her Seroquel  for the past 2 days.

## 2024-06-26 NOTE — Discharge Instructions (Signed)
 Follow-up with your doctor as needed.

## 2024-06-27 DIAGNOSIS — M549 Dorsalgia, unspecified: Secondary | ICD-10-CM | POA: Diagnosis not present

## 2024-06-27 DIAGNOSIS — E559 Vitamin D deficiency, unspecified: Secondary | ICD-10-CM | POA: Diagnosis not present

## 2024-06-27 DIAGNOSIS — F332 Major depressive disorder, recurrent severe without psychotic features: Secondary | ICD-10-CM | POA: Diagnosis not present

## 2024-06-27 DIAGNOSIS — M542 Cervicalgia: Secondary | ICD-10-CM | POA: Diagnosis not present
